# Patient Record
Sex: Female | Born: 1960 | Race: White | Hispanic: No | Marital: Married | Smoking: Never smoker
Health system: Southern US, Community
[De-identification: ages and names within clinical notes are randomized; demographics above are authoritative.]

## PROBLEM LIST (undated history)

## (undated) DIAGNOSIS — E079 Disorder of thyroid, unspecified: Secondary | ICD-10-CM

## (undated) DIAGNOSIS — E785 Hyperlipidemia, unspecified: Secondary | ICD-10-CM

## (undated) DIAGNOSIS — E119 Type 2 diabetes mellitus without complications: Secondary | ICD-10-CM

## (undated) HISTORY — PX: CARPAL TUNNEL RELEASE: SHX101

## (undated) HISTORY — DX: Hyperlipidemia, unspecified: E78.5

## (undated) HISTORY — PX: OTHER SURGICAL HISTORY: SHX169

## (undated) HISTORY — PX: APPENDECTOMY: SHX54

---

## 1998-05-24 ENCOUNTER — Ambulatory Visit (HOSPITAL_COMMUNITY): Admission: RE | Admit: 1998-05-24 | Discharge: 1998-05-24 | Payer: Self-pay | Admitting: Endocrinology

## 1998-12-25 ENCOUNTER — Other Ambulatory Visit: Admission: RE | Admit: 1998-12-25 | Discharge: 1998-12-25 | Payer: Self-pay | Admitting: *Deleted

## 1999-03-20 ENCOUNTER — Ambulatory Visit (HOSPITAL_COMMUNITY): Admission: RE | Admit: 1999-03-20 | Discharge: 1999-03-20 | Payer: Self-pay | Admitting: *Deleted

## 1999-03-20 ENCOUNTER — Encounter: Payer: Self-pay | Admitting: *Deleted

## 1999-03-26 ENCOUNTER — Ambulatory Visit (HOSPITAL_COMMUNITY): Admission: RE | Admit: 1999-03-26 | Discharge: 1999-03-26 | Payer: Self-pay | Admitting: *Deleted

## 1999-03-26 ENCOUNTER — Encounter: Payer: Self-pay | Admitting: *Deleted

## 2000-03-13 ENCOUNTER — Other Ambulatory Visit: Admission: RE | Admit: 2000-03-13 | Discharge: 2000-03-13 | Payer: Self-pay | Admitting: *Deleted

## 2000-06-16 ENCOUNTER — Other Ambulatory Visit: Admission: RE | Admit: 2000-06-16 | Discharge: 2000-06-16 | Payer: Self-pay | Admitting: *Deleted

## 2000-11-04 ENCOUNTER — Other Ambulatory Visit: Admission: RE | Admit: 2000-11-04 | Discharge: 2000-11-04 | Payer: Self-pay | Admitting: *Deleted

## 2000-11-20 ENCOUNTER — Ambulatory Visit (HOSPITAL_COMMUNITY): Admission: RE | Admit: 2000-11-20 | Discharge: 2000-11-20 | Payer: Self-pay | Admitting: *Deleted

## 2000-11-20 ENCOUNTER — Encounter: Payer: Self-pay | Admitting: *Deleted

## 2001-06-10 ENCOUNTER — Other Ambulatory Visit: Admission: RE | Admit: 2001-06-10 | Discharge: 2001-06-10 | Payer: Self-pay | Admitting: *Deleted

## 2001-06-10 ENCOUNTER — Encounter (INDEPENDENT_AMBULATORY_CARE_PROVIDER_SITE_OTHER): Payer: Self-pay

## 2002-06-11 ENCOUNTER — Other Ambulatory Visit: Admission: RE | Admit: 2002-06-11 | Discharge: 2002-06-11 | Payer: Self-pay | Admitting: *Deleted

## 2003-01-19 ENCOUNTER — Encounter: Payer: Self-pay | Admitting: *Deleted

## 2003-01-19 ENCOUNTER — Ambulatory Visit (HOSPITAL_COMMUNITY): Admission: RE | Admit: 2003-01-19 | Discharge: 2003-01-19 | Payer: Self-pay | Admitting: *Deleted

## 2003-01-20 ENCOUNTER — Encounter: Payer: Self-pay | Admitting: *Deleted

## 2003-01-20 ENCOUNTER — Encounter: Admission: RE | Admit: 2003-01-20 | Discharge: 2003-01-20 | Payer: Self-pay | Admitting: *Deleted

## 2003-06-17 ENCOUNTER — Other Ambulatory Visit: Admission: RE | Admit: 2003-06-17 | Discharge: 2003-06-17 | Payer: Self-pay | Admitting: *Deleted

## 2004-06-14 ENCOUNTER — Ambulatory Visit (HOSPITAL_COMMUNITY): Admission: RE | Admit: 2004-06-14 | Discharge: 2004-06-14 | Payer: Self-pay | Admitting: *Deleted

## 2004-06-20 ENCOUNTER — Other Ambulatory Visit: Admission: RE | Admit: 2004-06-20 | Discharge: 2004-06-20 | Payer: Self-pay | Admitting: *Deleted

## 2005-06-27 ENCOUNTER — Other Ambulatory Visit: Admission: RE | Admit: 2005-06-27 | Discharge: 2005-06-27 | Payer: Self-pay | Admitting: *Deleted

## 2005-07-29 ENCOUNTER — Ambulatory Visit (HOSPITAL_COMMUNITY): Admission: RE | Admit: 2005-07-29 | Discharge: 2005-07-29 | Payer: Self-pay | Admitting: *Deleted

## 2005-09-11 ENCOUNTER — Encounter: Admission: RE | Admit: 2005-09-11 | Discharge: 2005-09-11 | Payer: Self-pay | Admitting: Orthopaedic Surgery

## 2006-07-23 ENCOUNTER — Other Ambulatory Visit: Admission: RE | Admit: 2006-07-23 | Discharge: 2006-07-23 | Payer: Self-pay | Admitting: Obstetrics & Gynecology

## 2006-08-12 ENCOUNTER — Ambulatory Visit (HOSPITAL_COMMUNITY): Admission: RE | Admit: 2006-08-12 | Discharge: 2006-08-12 | Payer: Self-pay | Admitting: Obstetrics & Gynecology

## 2007-07-27 ENCOUNTER — Other Ambulatory Visit: Admission: RE | Admit: 2007-07-27 | Discharge: 2007-07-27 | Payer: Self-pay | Admitting: Obstetrics and Gynecology

## 2008-07-27 ENCOUNTER — Other Ambulatory Visit: Admission: RE | Admit: 2008-07-27 | Discharge: 2008-07-27 | Payer: Self-pay | Admitting: Obstetrics & Gynecology

## 2008-08-02 ENCOUNTER — Encounter: Admission: RE | Admit: 2008-08-02 | Discharge: 2008-08-02 | Payer: Self-pay | Admitting: Obstetrics & Gynecology

## 2008-09-01 ENCOUNTER — Ambulatory Visit (HOSPITAL_BASED_OUTPATIENT_CLINIC_OR_DEPARTMENT_OTHER): Admission: RE | Admit: 2008-09-01 | Discharge: 2008-09-01 | Payer: Self-pay | Admitting: Obstetrics and Gynecology

## 2009-08-29 ENCOUNTER — Encounter: Admission: RE | Admit: 2009-08-29 | Discharge: 2009-08-29 | Payer: Self-pay | Admitting: Obstetrics and Gynecology

## 2009-09-07 ENCOUNTER — Encounter: Admission: RE | Admit: 2009-09-07 | Discharge: 2009-09-07 | Payer: Self-pay | Admitting: Obstetrics and Gynecology

## 2011-01-13 ENCOUNTER — Encounter: Payer: Self-pay | Admitting: Otolaryngology

## 2011-01-13 ENCOUNTER — Encounter: Payer: Self-pay | Admitting: Obstetrics & Gynecology

## 2011-01-13 ENCOUNTER — Encounter: Payer: Self-pay | Admitting: *Deleted

## 2011-01-13 ENCOUNTER — Encounter: Payer: Self-pay | Admitting: Obstetrics and Gynecology

## 2011-05-07 NOTE — Op Note (Signed)
NAME:  Tammy Wu, Tammy Wu                 ACCOUNT NO.:  [id number]   MEDICAL RECORD NO.:  [id number]          PATIENT TYPE:  AMB   LOCATION:  [facility]                         FACILITY:  [facility]   PHYSICIAN:  Cynthia P. Romine, M.D.DATE OF BIRTH:  1961/01/07   DATE OF PROCEDURE:  09/01/2008  DATE OF DISCHARGE:                               OPERATIVE REPORT   PREOPERATIVE DIAGNOSIS:  Menorrhagia.   POSTOPERATIVE DIAGNOSIS:  Menorrhagia.   OPERATION/PROCEDURE:  Hydrotherm ablation of the endometrium.   SURGEON:  Cynthia P. Romine, M.D.   ANESTHESIA:  General by LMA.   ESTIMATED BLOOD LOSS:  Minimal.   COMPLICATIONS:  None.   PROCEDURE:  The patient was taken to the operating room and after  induction of adequate general anesthesia by LMA was placed in dorsal  lithotomy position and prepped and draped in the usual fashion.  The  bladder was drained with a red rubber catheter.  A posterior weighted  and anterior Sims retractor were placed and the cervix was grasped on  its anterior lip with a single-tooth tenaculum.  The uterus sounded to  7.5 cm.  The cervix was dilated to a #27 Shawnie Pons.  The hysteroscope was  then introduced and would not pass through the endocervix;  therefore,  the scope was withdrawn.  The cervix was dilated to a #29 and the scope  was then inserted.  The cavity appeared very atrophic.  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  With great persistence, the tubal ostia on the left  and right could be visualized but they were difficult to get to because  of the synechiae.  Once the surgeon was satisfied that that the cavity  had indeed been entered by noting both tubal ostia and the scope was  withdrawn to the level just inside the internal cervical os and  hydrotherm e ablation was carried out without difficulty in the usual  technique.   Photographic documentation was taken of the cavity after the  procedure.  The scope was withdrawn.  The fluid was cooled.  The  procedure was terminated.  The instruments removed from vagina and the  procedure was terminated.  The patient was taken to the recovery room in  satisfactory condition.      Cynthia P. Romine, M.D.  Electronically Signed     CPR/MEDQ  D:  09/01/2008  T:  09/02/2008  Job:  [id number]

## 2011-07-03 ENCOUNTER — Other Ambulatory Visit (HOSPITAL_COMMUNITY): Payer: Self-pay | Admitting: Family Medicine

## 2011-07-03 DIAGNOSIS — Z1231 Encounter for screening mammogram for malignant neoplasm of breast: Secondary | ICD-10-CM

## 2011-07-31 ENCOUNTER — Ambulatory Visit (HOSPITAL_COMMUNITY)
Admission: RE | Admit: 2011-07-31 | Discharge: 2011-07-31 | Disposition: A | Discharge status: Home / Self Care | Payer: BC Managed Care – PPO | Source: Ambulatory Visit | Attending: Family Medicine | Admitting: Family Medicine

## 2011-07-31 DIAGNOSIS — Z1231 Encounter for screening mammogram for malignant neoplasm of breast: Secondary | ICD-10-CM

## 2011-09-25 LAB — CBC
HCT: 41.9
Hemoglobin: 13.8
MCHC: 33.1
MCV: 84.5
Platelets: 410 — ABNORMAL HIGH
RBC: 4.95
RDW: 13.5

## 2011-09-25 LAB — HCG, SERUM, QUALITATIVE: Preg, Serum: NEGATIVE

## 2012-11-09 DIAGNOSIS — G43009 Migraine without aura, not intractable, without status migrainosus: Secondary | ICD-10-CM | POA: Insufficient documentation

## 2013-06-21 ENCOUNTER — Other Ambulatory Visit (HOSPITAL_COMMUNITY): Payer: Self-pay | Admitting: Family Medicine

## 2013-06-21 DIAGNOSIS — Z1231 Encounter for screening mammogram for malignant neoplasm of breast: Secondary | ICD-10-CM

## 2013-06-29 ENCOUNTER — Ambulatory Visit (HOSPITAL_COMMUNITY)
Admission: RE | Admit: 2013-06-29 | Discharge: 2013-06-29 | Disposition: A | Discharge status: Home / Self Care | Payer: BC Managed Care – PPO | Source: Ambulatory Visit | Attending: Family Medicine | Admitting: Family Medicine

## 2013-06-29 DIAGNOSIS — Z1231 Encounter for screening mammogram for malignant neoplasm of breast: Secondary | ICD-10-CM

## 2013-07-01 ENCOUNTER — Other Ambulatory Visit: Payer: Self-pay | Admitting: Family Medicine

## 2013-07-01 DIAGNOSIS — R928 Other abnormal and inconclusive findings on diagnostic imaging of breast: Secondary | ICD-10-CM

## 2013-07-13 ENCOUNTER — Ambulatory Visit
Admission: RE | Admit: 2013-07-13 | Discharge: 2013-07-13 | Disposition: A | Discharge status: Home / Self Care | Payer: BC Managed Care – PPO | Source: Ambulatory Visit | Attending: Family Medicine | Admitting: Family Medicine

## 2013-07-13 DIAGNOSIS — R928 Other abnormal and inconclusive findings on diagnostic imaging of breast: Secondary | ICD-10-CM

## 2014-06-27 ENCOUNTER — Other Ambulatory Visit (HOSPITAL_COMMUNITY): Payer: Self-pay | Admitting: Family Medicine

## 2014-06-27 DIAGNOSIS — Z1231 Encounter for screening mammogram for malignant neoplasm of breast: Secondary | ICD-10-CM

## 2014-06-29 DIAGNOSIS — R202 Paresthesia of skin: Secondary | ICD-10-CM | POA: Insufficient documentation

## 2014-06-30 DIAGNOSIS — E039 Hypothyroidism, unspecified: Secondary | ICD-10-CM | POA: Insufficient documentation

## 2014-07-14 ENCOUNTER — Ambulatory Visit (HOSPITAL_COMMUNITY)
Admission: RE | Admit: 2014-07-14 | Discharge: 2014-07-14 | Disposition: A | Discharge status: Home / Self Care | Payer: BC Managed Care – PPO | Source: Ambulatory Visit | Attending: Family Medicine | Admitting: Family Medicine

## 2014-07-14 DIAGNOSIS — Z1231 Encounter for screening mammogram for malignant neoplasm of breast: Secondary | ICD-10-CM | POA: Insufficient documentation

## 2015-09-21 ENCOUNTER — Other Ambulatory Visit: Payer: Self-pay | Admitting: Nurse Practitioner

## 2015-09-21 DIAGNOSIS — Z780 Asymptomatic menopausal state: Secondary | ICD-10-CM

## 2016-01-08 ENCOUNTER — Other Ambulatory Visit: Payer: Self-pay

## 2016-01-08 DIAGNOSIS — Z1231 Encounter for screening mammogram for malignant neoplasm of breast: Secondary | ICD-10-CM

## 2016-02-08 ENCOUNTER — Ambulatory Visit: Payer: BC Managed Care – PPO

## 2016-02-08 ENCOUNTER — Other Ambulatory Visit: Payer: BC Managed Care – PPO

## 2016-03-04 ENCOUNTER — Ambulatory Visit
Admission: RE | Admit: 2016-03-04 | Discharge: 2016-03-04 | Disposition: A | Discharge status: Home / Self Care | Payer: BC Managed Care – PPO | Source: Ambulatory Visit | Attending: Nurse Practitioner | Admitting: Nurse Practitioner

## 2016-03-04 ENCOUNTER — Ambulatory Visit
Admission: RE | Admit: 2016-03-04 | Discharge: 2016-03-04 | Disposition: A | Discharge status: Home / Self Care | Payer: BC Managed Care – PPO | Source: Ambulatory Visit

## 2016-03-04 DIAGNOSIS — Z780 Asymptomatic menopausal state: Secondary | ICD-10-CM

## 2016-03-04 DIAGNOSIS — Z1231 Encounter for screening mammogram for malignant neoplasm of breast: Secondary | ICD-10-CM

## 2016-04-18 DIAGNOSIS — L819 Disorder of pigmentation, unspecified: Secondary | ICD-10-CM | POA: Insufficient documentation

## 2016-04-28 ENCOUNTER — Encounter (HOSPITAL_COMMUNITY): Payer: Self-pay | Admitting: Nurse Practitioner

## 2016-04-28 ENCOUNTER — Ambulatory Visit (HOSPITAL_COMMUNITY)
Admission: EM | Admit: 2016-04-28 | Discharge: 2016-04-28 | Disposition: A | Discharge status: Home / Self Care | Payer: BC Managed Care – PPO | Attending: Emergency Medicine | Admitting: Emergency Medicine

## 2016-04-28 DIAGNOSIS — J309 Allergic rhinitis, unspecified: Secondary | ICD-10-CM

## 2016-04-28 DIAGNOSIS — R059 Cough, unspecified: Secondary | ICD-10-CM

## 2016-04-28 DIAGNOSIS — R05 Cough: Secondary | ICD-10-CM

## 2016-04-28 HISTORY — DX: Disorder of thyroid, unspecified: E07.9

## 2016-04-28 HISTORY — DX: Type 2 diabetes mellitus without complications: E11.9

## 2016-04-28 MED ORDER — IPRATROPIUM BROMIDE 0.02 % IN SOLN
RESPIRATORY_TRACT | Status: AC
  Filled 2016-04-28: qty 2.5

## 2016-04-28 MED ORDER — ALBUTEROL SULFATE HFA 108 (90 BASE) MCG/ACT IN AERS: 1.0000 | INHALATION_SPRAY | Freq: Four times a day (QID) | RESPIRATORY_TRACT | Status: AC | PRN

## 2016-04-28 MED ORDER — ALBUTEROL SULFATE (2.5 MG/3ML) 0.083% IN NEBU
2.5000 mg | INHALATION_SOLUTION | Freq: Once | RESPIRATORY_TRACT | Status: AC
  Administered 2016-04-28: 2.5 mg via RESPIRATORY_TRACT

## 2016-04-28 MED ORDER — IPRATROPIUM BROMIDE 0.02 % IN SOLN
0.5000 mg | Freq: Once | RESPIRATORY_TRACT | Status: AC
  Administered 2016-04-28: 0.5 mg via RESPIRATORY_TRACT

## 2016-04-28 MED ORDER — METHYLPREDNISOLONE SODIUM SUCC 125 MG IJ SOLR
125.0000 mg | Freq: Once | INTRAMUSCULAR | Status: AC
  Administered 2016-04-28: 125 mg via INTRAMUSCULAR

## 2016-04-28 MED ORDER — ALBUTEROL SULFATE (2.5 MG/3ML) 0.083% IN NEBU
INHALATION_SOLUTION | RESPIRATORY_TRACT | Status: AC
  Filled 2016-04-28: qty 3

## 2016-04-28 MED ORDER — METHYLPREDNISOLONE SODIUM SUCC 125 MG IJ SOLR
INTRAMUSCULAR | Status: AC
  Filled 2016-04-28: qty 2

## 2016-04-28 NOTE — Discharge Instructions (Signed)
Allergic Rhinitis Allergic rhinitis is when the mucous membranes in the nose respond to allergens. Allergens are particles in the air that cause your body to have an allergic reaction. This causes you to release allergic antibodies. Through a chain of events, these eventually cause you to release histamine into the blood stream. Although meant to protect the body, it is this release of histamine that causes your discomfort, such as frequent sneezing, congestion, and an itchy, runny nose.  CAUSES Seasonal allergic rhinitis (hay fever) is caused by pollen allergens that may come from grasses, trees, and weeds. Year-round allergic rhinitis (perennial allergic rhinitis) is caused by allergens such as house dust mites, pet dander, and mold spores. SYMPTOMS  Nasal stuffiness (congestion).  Itchy, runny nose with sneezing and tearing of the eyes. DIAGNOSIS Your health care provider can help you determine the allergen or allergens that trigger your symptoms. If you and your health care provider are unable to determine the allergen, skin or blood testing may be used. Your health care provider will diagnose your condition after taking your health history and performing a physical exam. Your health care provider may assess you for other related conditions, such as asthma, pink eye, or an ear infection. TREATMENT Allergic rhinitis does not have a cure, but it can be controlled by:  Medicines that block allergy symptoms. These may include allergy shots, nasal sprays, and oral antihistamines.  Avoiding the allergen. Hay fever may often be treated with antihistamines in pill or nasal spray forms. Antihistamines block the effects of histamine. There are over-the-counter medicines that may help with nasal congestion and swelling around the eyes. Check with your health care provider before taking or giving this medicine. If avoiding the allergen or the medicine prescribed do not work, there are many new medicines  your health care provider can prescribe. Stronger medicine may be used if initial measures are ineffective. Desensitizing injections can be used if medicine and avoidance does not work. Desensitization is when a patient is given ongoing shots until the body becomes less sensitive to the allergen. Make sure you follow up with your health care provider if problems continue. HOME CARE INSTRUCTIONS It is not possible to completely avoid allergens, but you can reduce your symptoms by taking steps to limit your exposure to them. It helps to know exactly what you are allergic to so that you can avoid your specific triggers. SEEK MEDICAL CARE IF:  You have a fever.  You develop a cough that does not stop easily (persistent).  You have shortness of breath.  You start wheezing.  Symptoms interfere with normal daily activities.   This information is not intended to replace advice given to you by your health care provider. Make sure you discuss any questions you have with your health care provider.   Document Released: 09/03/2001 Document Revised: 12/30/2014 Document Reviewed: 08/16/2013 Elsevier Interactive Patient Education 2016 Elsevier Inc. Bronchospasm, Adult A bronchospasm is when the tubes that carry air in and out of your lungs (airways) spasm or tighten. During a bronchospasm it is hard to breathe. This is because the airways get smaller. A bronchospasm can be triggered by:  Allergies. These may be to animals, pollen, food, or mold.  Infection. This is a common cause of bronchospasm.  Exercise.  Irritants. These include pollution, cigarette smoke, strong odors, aerosol sprays, and paint fumes.  Weather changes.  Stress.  Being emotional. HOME CARE   Always have a plan for getting help. Know when to call your doctor  doctor and local emergency services (911 in the [place] Know where you can get emergency care. °· Only take medicines as told by your doctor. °· If you were prescribed an  inhaler or nebulizer machine, ask your doctor how to use it correctly. Always use a spacer with your inhaler if you were given one. °· Stay calm during an attack. Try to relax and breathe more slowly. °· Control your home environment: °¨ Change your heating and air conditioning filter at least once a month. °¨ Limit your use of fireplaces and wood stoves. °¨ Do not  smoke. Do not  allow smoking in your home. °¨ Avoid perfumes and fragrances. °¨ Get rid of pests (such as roaches and mice) and their droppings. °¨ Throw away plants if you see mold on them. °¨ Keep your house clean and dust free. °¨ Replace carpet with wood, tile, or vinyl flooring. Carpet can trap dander and dust. °¨ Use allergy-proof pillows, mattress covers, and box spring covers. °¨ Wash bed sheets and blankets every week in hot water. Dry them in a dryer. °¨ Use blankets that are made of polyester or cotton. °¨ Wash hands frequently. °GET HELP IF: °· You have muscle aches. °· You have chest pain. °· The thick spit you spit or cough up (sputum) changes from clear or white to yellow, green, gray, or bloody. °· The thick spit you spit or cough up gets thicker. °· There are problems that may be related to the medicine you are given such as: °¨ A rash. °¨ Itching. °¨ Swelling. °¨ Trouble breathing. °GET HELP RIGHT AWAY IF: °· You feel you cannot breathe or catch your breath. °· You cannot stop coughing. °· Your treatment is not helping you breathe better. °· You have very bad chest pain. °MAKE SURE YOU:  °· Understand these instructions. °· Will watch your condition. °· Will get help right away if you are not doing well or get worse. °  °This information is not intended to replace advice given to you by your health care provider. Make sure you discuss any questions you have with your health care provider. °  °Document Released: 10/06/2009 Document Revised: 12/30/2014 Document Reviewed: 06/01/2013 °Elsevier Interactive Patient Education ©2016 Elsevier  Inc. ° °

## 2016-04-28 NOTE — ED Provider Notes (Signed)
CSN: [id number]     Arrival date & time 04/28/16  1301 History   None    Chief Complaint  Patient presents with  . Cough   (Consider location/radiation/quality/duration/timing/severity/associated sxs/prior Treatment)  HPI   The patient is a 55 year old female presenting today with complaints of "not feeling well" for 2 weeks.  The patient states that she has seasonal allergies for which she takes zyrtec.  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  States developed into a sinus infection for which she was placed on Augmentin last Tuesday with her primary.  States history of "wheezing" for which she is unable to use her inhaler as she is "out".  States has gotten worse over past 3-4 days with development of cough and wheezing.   Past Medical History  Diagnosis Date  . Thyroid disease   . Diabetes mellitus without complication [facility])    Past Surgical History  Procedure Laterality Date  . Appendectomy     History reviewed. No pertinent family history. Social History  Substance Use Topics  . Smoking status: Never Smoker   . Smokeless tobacco: None  . Alcohol Use: Yes   OB History    No data available     Review of Systems  Constitutional: Negative.  Negative for fever and chills.  HENT: Negative.  Negative for sinus pressure, sneezing and sore throat.   Eyes: Negative.   Respiratory: Positive for cough, shortness of breath and wheezing.   Cardiovascular: Negative.   Gastrointestinal: Negative.   Endocrine: Negative.        History of diabetes.  States she is well controlled.   Genitourinary: Negative.   Musculoskeletal: Negative.   Skin: Negative.  Negative for pallor and rash.  Allergic/Immunologic: Positive for environmental allergies. Negative for food allergies and immunocompromised state.  Neurological: Negative.   Hematological: Negative.   Psychiatric/Behavioral: Negative.     Allergies  Benadryl and Ceclor  Home Medications   Prior to Admission medications    Medication Sig Start Date End Date Taking? Authorizing Provider  cetirizine (ZYRTEC) 5 MG tablet Take 5 mg by mouth daily.   Yes Historical Provider, MD  glimepiride (AMARYL) 1 MG tablet Take 1 mg by mouth daily with breakfast.   Yes Historical Provider, MD  levothyroxine (SYNTHROID, LEVOTHROID) 112 MCG tablet Take 112 mcg by mouth daily before breakfast.   Yes Historical Provider, MD  pravastatin (PRAVACHOL) 10 MG tablet Take by mouth daily.   Yes Historical Provider, MD  sitaGLIPtin (JANUVIA) 100 MG tablet Take 100 mg by mouth daily.   Yes Historical Provider, MD  topiramate (TOPAMAX) 15 MG capsule Take 15 mg by mouth 2 (two) times daily.   Yes Historical Provider, MD  albuterol (PROVENTIL HFA;VENTOLIN HFA) 108 (90 Base) MCG/ACT inhaler Inhale 1-2 puffs into the lungs every 6 (six) hours as needed for wheezing or shortness of breath. 04/28/16   Servando Salina, NP   Meds Ordered and Administered this Visit   Medications  methylPREDNISolone sodium succinate (SOLU-MEDROL) 125 mg/2 mL injection 125 mg (not administered)  albuterol (PROVENTIL) (2.5 MG/3ML) 0.083% nebulizer solution 2.5 mg (2.5 mg Nebulization Given 04/28/16 1354)  ipratropium (ATROVENT) nebulizer solution 0.5 mg (0.5 mg Nebulization Given 04/28/16 1354)    BP 146/85 mmHg  Pulse 77  Temp(Src) 97.8 F (36.6 C) (Oral)  Resp 17  SpO2 98% No data found.   Physical Exam  Constitutional: She appears well-developed and well-nourished. No distress.  HENT:  Head: Normocephalic and atraumatic.  Right  Ear: External ear normal.  Left Ear: External ear normal.  Mouth/Throat: Oropharynx is clear and moist. No oropharyngeal exudate.  Eyes: Pupils are equal, round, and reactive to light. Right eye exhibits no discharge. Left eye exhibits no discharge. No scleral icterus.  Neck: Normal range of motion. Neck supple. No tracheal deviation present.  Negative for nuchal rigidity.  Cardiovascular: Normal rate, regular rhythm, normal heart  sounds and intact distal pulses.  Exam reveals no gallop and no friction rub.   No murmur heard. Pulmonary/Chest: Effort normal and breath sounds normal. No stridor. No respiratory distress. She has no wheezes. She has no rales. She exhibits no tenderness.  Some coughing noted with deep breathing.  Air exchange diminished at bases.  No adventitious breath sounds noted.   Lymphadenopathy:    She has no cervical adenopathy.  Skin: Skin is warm and dry. No rash noted. She is not diaphoretic. No erythema. No pallor.  Nursing note and vitals reviewed.   ED Course  Procedures (including critical care time)  Labs Review Labs Reviewed - No data to display  Imaging Review No results found.   Patient given duoneb HHN.  Tolerated well.  Increased air exchange heard in bases and patient is able to take a deep breath without coughing. The patient states she wants "a steroid shot".  States will help resolve symptoms.  Understands increased risk of hyperglycemia.  Patient states diabetes well controlled with last A1C at 7.0   MDM   1. Allergic rhinitis, unspecified allergic rhinitis type   2. Cough    Meds ordered this encounter  Medications  . levothyroxine (SYNTHROID, LEVOTHROID) 112 MCG tablet    Sig: Take 112 mcg by mouth daily before breakfast.  . sitaGLIPtin (JANUVIA) 100 MG tablet    Sig: Take 100 mg by mouth daily.  Marland Kitchen. glimepiride (AMARYL) 1 MG tablet    Sig: Take 1 mg by mouth daily with breakfast.  . pravastatin (PRAVACHOL) 10 MG tablet    Sig: Take by mouth daily.  Marland Kitchen. topiramate (TOPAMAX) 15 MG capsule    Sig: Take 15 mg by mouth 2 (two) times daily.  . cetirizine (ZYRTEC) 5 MG tablet    Sig: Take 5 mg by mouth daily.  Marland Kitchen. albuterol (PROVENTIL) (2.5 MG/3ML) 0.083% nebulizer solution 2.5 mg    Sig:   . ipratropium (ATROVENT) nebulizer solution 0.5 mg    Sig:   . methylPREDNISolone sodium succinate (SOLU-MEDROL) 125 mg/2 mL injection 125 mg    Sig:   . albuterol (PROVENTIL  HFA;VENTOLIN HFA) 108 (90 Base) MCG/ACT inhaler    Sig: Inhale 1-2 puffs into the lungs every 6 (six) hours as needed for wheezing or shortness of breath.    Dispense:  1 Inhaler    Refill:  3    Plan of care was discussed with Dr. Artis FlockKindl.  The patient verbalizes understanding and agrees to plan of care.  The patient was given solumedrol injection and albuterol inhalers.    Servando Salinaatherine H Rossi, NP 04/28/16 1430

## 2016-04-28 NOTE — ED Notes (Signed)
Pt c/o 2 week history of nasal congestion, fevers. Her pcp placed her on augmentin for a sinus infection but her symptoms have persisted and now she is having a cough and feels like her cold has "moved to my chest." she is alert and breathing easily

## 2016-11-17 ENCOUNTER — Ambulatory Visit (HOSPITAL_COMMUNITY)
Admission: EM | Admit: 2016-11-17 | Discharge: 2016-11-17 | Disposition: A | Discharge status: Home / Self Care | Payer: BC Managed Care – PPO | Attending: Internal Medicine | Admitting: Internal Medicine

## 2016-11-17 ENCOUNTER — Encounter (HOSPITAL_COMMUNITY): Payer: Self-pay | Admitting: Emergency Medicine

## 2016-11-17 DIAGNOSIS — J4521 Mild intermittent asthma with (acute) exacerbation: Secondary | ICD-10-CM | POA: Diagnosis not present

## 2016-11-17 DIAGNOSIS — B9689 Other specified bacterial agents as the cause of diseases classified elsewhere: Secondary | ICD-10-CM

## 2016-11-17 DIAGNOSIS — J019 Acute sinusitis, unspecified: Secondary | ICD-10-CM

## 2016-11-17 MED ORDER — METHYLPREDNISOLONE SODIUM SUCC 125 MG IJ SOLR
125.0000 mg | Freq: Once | INTRAMUSCULAR | Status: AC
  Administered 2016-11-17: 125 mg via INTRAMUSCULAR

## 2016-11-17 MED ORDER — METHYLPREDNISOLONE SODIUM SUCC 125 MG IJ SOLR
INTRAMUSCULAR | Status: AC
  Filled 2016-11-17: qty 2

## 2016-11-17 MED ORDER — AMOXICILLIN-POT CLAVULANATE 875-125 MG PO TABS: 1.0000 | ORAL_TABLET | Freq: Two times a day (BID) | ORAL | 0 refills | Status: DC

## 2016-11-17 NOTE — ED Triage Notes (Signed)
PT reports facial pain and pressure with sinus drainage and chest congestion since Tuesday.

## 2016-11-17 NOTE — ED Provider Notes (Signed)
[facility]    CSN: [id number] Arrival date & time: 11/17/16  1523     History   Chief Complaint Chief Complaint  Patient presents with  . Facial Pain    HPI Tammy Wu is a 55 y.o. female. She is a Sport and exercise psychologistsinger. She has history of asthma and diabetes. She presents today with 5 day history of runny/congested nose, headache, sore throat. Dry cough, starting to be sore from coughing. Little bit of achiness. Hoarseness. No nausea/vomiting, no diarrhea. She says that she is prone to this sort of thing, and sometimes has to have steroids. She has to sing this week.    HPI  Past Medical History:  Diagnosis Date  . Diabetes mellitus without complication (HCC)   . Thyroid disease      Past Surgical History:  Procedure Laterality Date  . APPENDECTOMY    . CARPAL TUNNEL RELEASE    . uterine ablation         Home Medications    Prior to Admission medications   Medication Sig Start Date End Date Taking? Authorizing Provider  albuterol (PROVENTIL HFA;VENTOLIN HFA) 108 (90 Base) MCG/ACT inhaler Inhale 1-2 puffs into the lungs every 6 (six) hours as needed for wheezing or shortness of breath. 04/28/16   Servando Salinaatherine H Rossi, NP  amoxicillin-clavulanate (AUGMENTIN) 875-125 MG tablet Take 1 tablet by mouth every 12 (twelve) hours. 11/17/16   Eustace MooreLaura W Opie Fanton, MD  cetirizine (ZYRTEC) 5 MG tablet Take 5 mg by mouth daily.    Historical Provider, MD  glimepiride (AMARYL) 1 MG tablet Take 1 mg by mouth daily with breakfast.    Historical Provider, MD  levothyroxine (SYNTHROID, LEVOTHROID) 112 MCG tablet Take 112 mcg by mouth daily before breakfast.    Historical Provider, MD  pravastatin (PRAVACHOL) 10 MG tablet Take by mouth daily.    Historical Provider, MD  sitaGLIPtin (JANUVIA) 100 MG tablet Take 100 mg by mouth daily.    Historical Provider, MD  topiramate (TOPAMAX) 15 MG capsule Take 15 mg by mouth 2 (two) times daily.    Historical Provider, MD    Family History No family  history on file.  Social History Social History  Substance Use Topics  . Smoking status: Never Smoker  . Smokeless tobacco: Never Used  . Alcohol use Yes     Comment: rarely     Allergies   Benadryl [diphenhydramine] and Ceclor [cefaclor]   Review of Systems Review of Systems  All other systems reviewed and are negative.    Physical Exam Triage Vital Signs ED Triage Vitals  Enc Vitals Group     BP 11/17/16 1608 139/80     Pulse Rate 11/17/16 1608 92     Resp 11/17/16 1608 16     Temp 11/17/16 1608 98.6 F (37 C)     Temp Source 11/17/16 1608 Oral     SpO2 11/17/16 1608 99 %     Weight 11/17/16 1608 116 lb (52.6 kg)     Height 11/17/16 1608 5\' 2"  (1.575 m)     Pain Score 11/17/16 1609 4   Updated Vital Signs BP 139/80   Pulse 92   Temp 98.6 F (37 C) (Oral)   Resp 16   Ht 5\' 2"  (1.575 m)   Wt 116 lb (52.6 kg)   SpO2 99%   BMI 21.22 kg/m  Physical Exam  Constitutional: She is oriented to person, place, and time.  Alert, nicely groomed Looks ill but not toxic  HENT:  Head: Atraumatic.  Bilateral TMs are dull, no erythema Marked nasal congestion Throat is red  Eyes:  Conjugate gaze, no eye redness/drainage  Neck: Neck supple.  Cardiovascular: Normal rate and regular rhythm.   Pulmonary/Chest: No respiratory distress.  Coarse but symmetric breath sounds throughout. Frequent coughing during exam  Abdominal: She exhibits no distension.  Musculoskeletal: Normal range of motion.  No leg swelling  Neurological: She is alert and oriented to person, place, and time.  Skin: Skin is warm and dry.  No cyanosis  Nursing note and vitals reviewed.    UC Treatments / Results   Procedures Procedures (including critical care time)  Medications Ordered in UC Medications  methylPREDNISolone sodium succinate (SOLU-MEDROL) 125 mg/2 mL injection 125 mg (125 mg Intramuscular Given 11/17/16 1658)     Final Clinical Impressions(s) / UC Diagnoses   Final  diagnoses:  Mild intermittent asthmatic bronchitis with acute exacerbation  Acute bacterial sinusitis   Injection of solumedrol (steroid, methylprednisolone, 125mg ) was given at the urgent care today.  Prescription for amoxicillin/clavulanate was sent to the [facility] at [place].  Recheck for increasing phlegm production/nasal discharge, new fever >100.5, or if not starting to improve in a few days.  New Prescriptions Discharge Medication List as of 11/17/2016  4:54 PM    START taking these medications   Details  amoxicillin-clavulanate (AUGMENTIN) 875-125 MG tablet Take 1 tablet by mouth every 12 (twelve) hours., Starting Sun 11/17/2016, Normal         Eustace MooreLaura W Monterius Rolf, MD 11/24/16 2222

## 2016-11-17 NOTE — Discharge Instructions (Addendum)
Injection of solumedrol (steroid, methylprednisolone, 125mg ) was given at the urgent care today.  Prescription for amoxicillin/clavulanate was sent to the [facility] at [place].  Recheck for increasing phlegm production/nasal discharge, new fever >100.5, or if not starting to improve in a few days.

## 2017-03-04 ENCOUNTER — Ambulatory Visit (INDEPENDENT_AMBULATORY_CARE_PROVIDER_SITE_OTHER): Payer: BC Managed Care – PPO | Admitting: Sports Medicine

## 2017-03-04 ENCOUNTER — Ambulatory Visit: Payer: Self-pay

## 2017-03-04 ENCOUNTER — Ambulatory Visit (INDEPENDENT_AMBULATORY_CARE_PROVIDER_SITE_OTHER): Payer: BC Managed Care – PPO

## 2017-03-04 ENCOUNTER — Encounter: Payer: Self-pay | Admitting: Sports Medicine

## 2017-03-04 VITALS — BP 130/90 | HR 74 | Ht 62.0 in | Wt 118.2 lb

## 2017-03-04 DIAGNOSIS — S0993XA Unspecified injury of face, initial encounter: Secondary | ICD-10-CM

## 2017-03-04 DIAGNOSIS — S8991XA Unspecified injury of right lower leg, initial encounter: Secondary | ICD-10-CM | POA: Diagnosis not present

## 2017-03-04 DIAGNOSIS — K219 Gastro-esophageal reflux disease without esophagitis: Secondary | ICD-10-CM | POA: Insufficient documentation

## 2017-03-04 DIAGNOSIS — S4991XA Unspecified injury of right shoulder and upper arm, initial encounter: Secondary | ICD-10-CM

## 2017-03-04 DIAGNOSIS — E119 Type 2 diabetes mellitus without complications: Secondary | ICD-10-CM

## 2017-03-04 MED ORDER — NAPROXEN-ESOMEPRAZOLE 500-20 MG PO TBEC: 1.0000 | DELAYED_RELEASE_TABLET | Freq: Two times a day (BID) | ORAL | 2 refills | Status: DC

## 2017-03-04 NOTE — Assessment & Plan Note (Signed)
Injury consistent with a right shoulder contusion.  If any lack of improvement consider further evaluation with diagnostic ultrasound of the right shoulder.

## 2017-03-04 NOTE — Assessment & Plan Note (Addendum)
Patient had a direct fall onto her knee with generalized swelling and pain. I am concerned for potential acute avulsion fracture of the patellar tendon at the medial portion of the tibial tubercle.  CT scan of the right knee indicated given concern for potential weightbearing nondisplaced fracture.  Knee immobilizer provided and recommend minimizing activity until further evaluation.

## 2017-03-04 NOTE — Assessment & Plan Note (Signed)
She Does Have a History of GI Intolerance and a History of Acid Reflux Therefore Vimovo Prescribed.

## 2017-03-04 NOTE — Progress Notes (Signed)
Tammy Wu - 56 y.o. female MRN [id number]  Date of birth: 09-19-1961  Office Visit Note: Visit Date: 03/04/2017 PCP: Tammy Inch, MD Referred by: No ref. provider found  Subjective: Chief Complaint  Patient presents with  . A Fall at [facility] 03/04/17    Pt was cleaning snow off her car this morning when she slipped and fell forward on concrete. C/o of RT shoulder, Hip, Leg, and face Pain. No loss of consciousness. Pt has applied ice to the areas after incident with relief. Brusing with abrasions on knee and nose.   HPI: Patient reports a fall today landing directly onto anterior aspect of her right knee as well as right upper extremity.  She did strike her face and is having pain along the tip of her nose but no nasal bridge discomfort.  She denies any nausea, vomiting, loss of consciousness blurry vision, difficulty focusing or headache.  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 ROS: She is not any type of blood thinning medications.  No prior issues with her right knee or right shoulder.  Otherwise per HPI.  Objective:  VS:  HT:5\' 2"  (157.5 cm)   WT:118 lb 3.2 oz (53.6 kg)  BMI:21.7    BP:130/90  HR:74bpm  TEMP: ( )  RESP:98 % Physical Exam: GENERAL:  WDWN, NAD, Non-toxic appearing PSYCH:  Alert & appropriately interactive  Not depressed or anxious appearing  Head is generally atraumatic with only small superficial abrasion across the bridge of her nose as well as the tip of her nose.  She has no pain to palpation along the nasal bone, frontal bones, maxillary bones or mandible.  She has good alignment of her jaws. UPPER & LOWER EXTREMITIES:  No significant generalized UE edema.  No significant pretibial edema.  No clubbing or cyanosis.  Radial pulses 2+/4.  DP & PT pulses 2+/4  Sensation intact to light touch in upper and lower extremities. Right shoulder: Patient is only minimally guarding this.  She has no focal tenderness to  palpation across the clavicle, AC joint, AC joint, acromion, proximal humerus.  Internal rotation external rotation strength is intact.  She has good elbow and wrist motion.  No focal bony tenderness there.  Minimal pain with Hawkins and Neer's.  Empty can testing, speeds testing and Yergason's testing strength is intact with only mild pain. Right knee: Small amount of generalized swelling without significant effusion.  Marked tenderness to palpation across the anterior aspect of the knee at the distal pole of patella and most focally over the anterior proximal tibia.  Moderate to severe pain along the lateral joint line.  Minimal on the medial.  She has a moderate degree of pain with varus and valgus stressing.  Small amount of bruising across the anterior aspect of the tibia and inferior pole of patella.  Extensor mechanism is intact.   Imaging & Procedures: Dg Knee Complete 4 Views Right  Result Date: 03/04/2017 CLINICAL DATA:  Left and fell on ice this morning with persistent pain and erythema over the anterior aspect of the patella and proximal tibia. EXAM: RIGHT KNEE - COMPLETE 4+ VIEW COMPARISON:  None in PACs FINDINGS: The bones are subjectively osteopenic. There is no lytic lesion but a subtle permeative pattern is present. The joint spaces are well maintained. There is no acute fracture nor dislocation. There is a sliver of bone that projects over the tibial tuberosity that could reflect an avulsion fracture fragment  but this is of uncertain age. No significant overlying soft tissue swelling is observed. There is no joint effusion. The prepatellar and infrapatellar soft tissues are normal. IMPRESSION: Diffuse osteopenia. No acute fracture nor dislocation nor significant degenerative change. Probable old avulsion fracture from the tibial tuberosity. Electronically Signed   By: David  SwazilandJordan M.D.   On: 03/04/2017 16:33   Limited bedside ultrasound performed images were not able to be saved however.   There was some neovascularity with a hypoechoic change with cortical irregularity at the insertion of the patellar tendon at the tibial tubercle.  Pain with sono palpation.  No focal osseous irregularities along the medial or lateral tibial plateaus although limited exam.  Does appear to be some increased swelling along the insertion of the LCL on the tibia with overlying hematoma that is small.  Only minimal effusion.  Assessment & Plan: Problem List Items Addressed This Visit    Knee injury, right, initial encounter - Primary    Patient had a direct fall onto her knee with generalized swelling and pain. I am concerned for potential acute avulsion fracture of the patellar tendon at the medial portion of the tibial tubercle.  CT scan of the right knee indicated given concern for potential weightbearing nondisplaced fracture.  Knee immobilizer provided and recommend minimizing activity until further evaluation.      Relevant Orders   DG Knee Complete 4 Views Right (Completed)   CT KNEE RIGHT WO CONTRAST   Injury of right shoulder    Injury consistent with a right shoulder contusion.  If any lack of improvement consider further evaluation with diagnostic ultrasound of the right shoulder.      Facial trauma, initial encounter    Superficial abrasions only.  No evidence or signs of significant intracranial injury.  No history of anticoagulation.  Patient denies signs of a concussion.      Diabetes mellitus without complication (HCC)    Need to minimize the use of NSAIDs however the patient declined any type of other pain control medication.       Acid reflux    She Does Have a History of GI Intolerance and a History of Acid Reflux Therefore Vimovo Prescribed.         Follow-up: Return for  we will call you about your results of the CT Scan.   Past Medical/Family/Surgical/Social History: Medications & Allergies reviewed per EMR Patient Active Problem List   Diagnosis Date Noted  .  Knee injury, right, initial encounter 03/04/2017  . Injury of right shoulder 03/04/2017  . Facial trauma, initial encounter 03/04/2017  . Diabetes mellitus without complication (HCC) 03/04/2017  . Acid reflux 03/04/2017   Past Medical History:  Diagnosis Date  . Diabetes mellitus without complication (HCC)   . Thyroid disease    History reviewed. No pertinent family history. Past Surgical History:  Procedure Laterality Date  . APPENDECTOMY    . CARPAL TUNNEL RELEASE    . uterine ablation     Social History   Occupational History  . Not on file.   Social History Main Topics  . Smoking status: Never Smoker  . Smokeless tobacco: Never Used  . Alcohol use Yes     Comment: rarely  . Drug use: No  . Sexual activity: Not on file

## 2017-03-04 NOTE — Assessment & Plan Note (Signed)
Superficial abrasions only.  No evidence or signs of significant intracranial injury.  No history of anticoagulation.  Patient denies signs of a concussion.

## 2017-03-04 NOTE — Patient Instructions (Signed)
We will plan to call you with results of the CT scan once we have them obtained.  I have sent a prescription to your pharmacy to [facility] will be calling you from a [phone] number.  This is for the anti-inflammatories.  Continue to ice..Marland Kitchen

## 2017-03-04 NOTE — Assessment & Plan Note (Signed)
Need to minimize the use of NSAIDs however the patient declined any type of other pain control medication.

## 2017-03-07 ENCOUNTER — Other Ambulatory Visit: Payer: BC Managed Care – PPO

## 2017-03-10 ENCOUNTER — Telehealth: Payer: Self-pay | Admitting: Sports Medicine

## 2017-03-10 ENCOUNTER — Ambulatory Visit
Admission: RE | Admit: 2017-03-10 | Discharge: 2017-03-10 | Disposition: A | Discharge status: Home / Self Care | Payer: BC Managed Care – PPO | Source: Ambulatory Visit | Attending: Sports Medicine | Admitting: Sports Medicine

## 2017-03-10 DIAGNOSIS — S8991XA Unspecified injury of right lower leg, initial encounter: Secondary | ICD-10-CM

## 2017-03-10 NOTE — Telephone Encounter (Signed)
Patient's husband came in to advise that patient needs a brace that leaves patella open, lateral and medial supports. Patient is unsure about coverage however, he would like to go to a sports store to possibly purchase. Please call patient's husband to advise on next steps.

## 2017-03-10 NOTE — Telephone Encounter (Signed)
I personally called and left a message advising the patient that a hinged knee brace is what I would recommend at this time.  I am happy to see her back and sure the options that we have otherwise an over-the-counter brace is acceptable at this time.  Her CT scan was negative for an acute fracture and she will need further evaluation if any lack of improvement.  If she calls back I am happy to try take her phone call otherwise we will try to touch base with her again tomorrow morning.

## 2017-03-11 NOTE — Telephone Encounter (Signed)
Patient returned call. I advised patient on note from Dr. Berline Choughigby. Patient expressed understanding verbally and exclaimed that she would call back if she needed to come back in for further evaluation. Patient did not ask any further clinical questions.

## 2017-07-31 DIAGNOSIS — E1169 Type 2 diabetes mellitus with other specified complication: Secondary | ICD-10-CM | POA: Insufficient documentation

## 2017-07-31 DIAGNOSIS — E890 Postprocedural hypothyroidism: Secondary | ICD-10-CM | POA: Insufficient documentation

## 2017-07-31 DIAGNOSIS — E785 Hyperlipidemia, unspecified: Secondary | ICD-10-CM | POA: Insufficient documentation

## 2017-11-28 DIAGNOSIS — R52 Pain, unspecified: Secondary | ICD-10-CM | POA: Insufficient documentation

## 2018-05-17 ENCOUNTER — Encounter (HOSPITAL_COMMUNITY): Payer: Self-pay | Admitting: Emergency Medicine

## 2018-05-17 ENCOUNTER — Ambulatory Visit (HOSPITAL_COMMUNITY)
Admission: EM | Admit: 2018-05-17 | Discharge: 2018-05-17 | Disposition: A | Discharge status: Home / Self Care | Payer: BC Managed Care – PPO | Attending: Family Medicine | Admitting: Family Medicine

## 2018-05-17 DIAGNOSIS — H9203 Otalgia, bilateral: Secondary | ICD-10-CM | POA: Diagnosis present

## 2018-05-17 DIAGNOSIS — J029 Acute pharyngitis, unspecified: Secondary | ICD-10-CM | POA: Diagnosis present

## 2018-05-17 DIAGNOSIS — E079 Disorder of thyroid, unspecified: Secondary | ICD-10-CM | POA: Diagnosis not present

## 2018-05-17 DIAGNOSIS — Z794 Long term (current) use of insulin: Secondary | ICD-10-CM | POA: Insufficient documentation

## 2018-05-17 DIAGNOSIS — E119 Type 2 diabetes mellitus without complications: Secondary | ICD-10-CM | POA: Diagnosis not present

## 2018-05-17 DIAGNOSIS — J0100 Acute maxillary sinusitis, unspecified: Secondary | ICD-10-CM | POA: Diagnosis not present

## 2018-05-17 DIAGNOSIS — Z79899 Other long term (current) drug therapy: Secondary | ICD-10-CM | POA: Diagnosis not present

## 2018-05-17 LAB — POCT RAPID STREP A: Streptococcus, Group A Screen (Direct): NEGATIVE

## 2018-05-17 MED ORDER — FLUCONAZOLE 150 MG PO TABS: 150.0000 mg | ORAL_TABLET | Freq: Once | ORAL | 0 refills | Status: AC

## 2018-05-17 MED ORDER — LEVOFLOXACIN 500 MG PO TABS: 500.0000 mg | ORAL_TABLET | Freq: Every day | ORAL | 0 refills | Status: DC

## 2018-05-17 NOTE — ED Provider Notes (Signed)
[facility]   [id number] 05/17/18 Arrival Time: 1429   SUBJECTIVE:  Tammy Wu is a 57 y.o. female who presents to the urgent care with complaint of bilateral ear pain and sore throat, states "I've had a sinus infection for a month"  Patient was given amoxicillin 2 weeks ago but this did not help at all. She has a history of allergies and has been taking allergy medicine which is also been of no avail.  Patient teaches music at [employer]  Past Medical History:  Diagnosis Date  . Diabetes mellitus without complication (HCC)   . Thyroid disease    No family history on file. Social History   Socioeconomic History  . Marital status: Married    Spouse name: Not on file  . Number of children: Not on file  . Years of education: Not on file  . Highest education level: Not on file  Occupational History  . Not on file  Social Needs  . Financial resource strain: Not on file  . Food insecurity:    Worry: Not on file    Inability: Not on file  . Transportation needs:    Medical: Not on file    Non-medical: Not on file  Tobacco Use  . Smoking status: Never Smoker  . Smokeless tobacco: Never Used  Substance and Sexual Activity  . Alcohol use: Yes    Comment: rarely  . Drug use: No  . Sexual activity: Not on file  Lifestyle  . Physical activity:    Days per week: Not on file    Minutes per session: Not on file  . Stress: Not on file  Relationships  . Social connections:    Talks on phone: Not on file    Gets together: Not on file    Attends religious service: Not on file    Active member of club or organization: Not on file    Attends meetings of clubs or organizations: Not on file    Relationship status: Not on file  . Intimate partner violence:    Fear of current or ex partner: Not on file    Emotionally abused: Not on file    Physically abused: Not on file    Forced sexual activity: Not on file  Other Topics Concern  . Not on file  Social  History Narrative  . Not on file   Current Meds  Medication Sig  . ATORVASTATIN CALCIUM PO Take by mouth.  . fexofenadine (ALLEGRA) 60 MG tablet Take 60 mg by mouth 2 (two) times daily.  . insulin aspart (NOVOLOG) 100 UNIT/ML injection Inject into the skin 3 (three) times daily before meals.  . INSULIN DEGLUDEC [place] Inject into the skin.   Allergies  Allergen Reactions  . Benadryl [Diphenhydramine] Anaphylaxis  . Codeine Other (See Comments)    vomiting  . Ceclor [Cefaclor]       ROS: As per HPI, remainder of ROS negative.   OBJECTIVE:   Vitals:   05/17/18 1516  BP: (!) 131/50  Pulse: 73  Resp: 16  Temp: 98.2 F (36.8 C)  SpO2: 100%     General appearance: alert; no distress Eyes: PERRL; EOMI; conjunctiva normal HENT: normocephalic; atraumatic; TMs normal, canal normal, external ears normal without trauma; nasal mucosa normal; oral mucosa normal Neck: supple Lungs: clear to auscultation bilaterally Heart: regular rate and rhythm Abdomen: soft, non-tender; bowel sounds normal; no masses or organomegaly; no guarding or rebound tenderness Back: no CVA tenderness Extremities:  no cyanosis or edema; symmetrical with no gross deformities Skin: warm and dry Neurologic: normal gait; grossly normal Psychological: alert and cooperative; normal mood and affect      Labs:  Results for orders placed or performed during the hospital encounter of 05/17/18  POCT rapid strep A [facility])  Result Value Ref Range   Streptococcus, Group A Screen (Direct) NEGATIVE NEGATIVE    Labs Reviewed  CULTURE, GROUP A STREP [facility])  POCT RAPID STREP A    No results found.     ASSESSMENT & PLAN:  1. Acute non-recurrent maxillary sinusitis     Meds ordered this encounter  Medications  . levofloxacin (LEVAQUIN) 500 MG tablet    Sig: Take 1 tablet (500 mg total) by mouth daily.    Dispense:  10 tablet    Refill:  0  . fluconazole (DIFLUCAN) 150 MG tablet    Sig: Take  1 tablet (150 mg total) by mouth once for 1 dose. Repeat if needed    Dispense:  2 tablet    Refill:  0    Reviewed expectations re: course of current medical issues. Questions answered. Outlined signs and symptoms indicating need for more acute intervention. Patient verbalized understanding. After Visit Summary given.    Procedures:      Elvina Sidle, MD 05/17/18 1545

## 2018-05-17 NOTE — ED Triage Notes (Signed)
Pt c/o bilateral ear pain and sore throat, states "ive had a sinus infection for a month"

## 2018-05-20 LAB — CULTURE, GROUP A STREP (THRC)

## 2018-06-24 ENCOUNTER — Other Ambulatory Visit: Payer: Self-pay | Admitting: Nurse Practitioner

## 2018-06-24 DIAGNOSIS — Z1231 Encounter for screening mammogram for malignant neoplasm of breast: Secondary | ICD-10-CM

## 2018-07-27 ENCOUNTER — Ambulatory Visit
Admission: RE | Admit: 2018-07-27 | Discharge: 2018-07-27 | Disposition: A | Discharge status: Home / Self Care | Payer: BC Managed Care – PPO | Source: Ambulatory Visit | Attending: Nurse Practitioner | Admitting: Nurse Practitioner

## 2018-07-27 DIAGNOSIS — Z1231 Encounter for screening mammogram for malignant neoplasm of breast: Secondary | ICD-10-CM

## 2019-07-19 ENCOUNTER — Other Ambulatory Visit: Payer: Self-pay | Admitting: Nurse Practitioner

## 2019-07-19 DIAGNOSIS — Z1231 Encounter for screening mammogram for malignant neoplasm of breast: Secondary | ICD-10-CM

## 2019-09-01 ENCOUNTER — Other Ambulatory Visit: Payer: Self-pay

## 2019-09-01 ENCOUNTER — Ambulatory Visit
Admission: RE | Admit: 2019-09-01 | Discharge: 2019-09-01 | Disposition: A | Discharge status: Home / Self Care | Payer: BC Managed Care – PPO | Source: Ambulatory Visit | Attending: Nurse Practitioner | Admitting: Nurse Practitioner

## 2019-09-01 DIAGNOSIS — Z1231 Encounter for screening mammogram for malignant neoplasm of breast: Secondary | ICD-10-CM

## 2019-12-28 ENCOUNTER — Other Ambulatory Visit: Payer: Self-pay

## 2019-12-28 ENCOUNTER — Encounter: Payer: Self-pay | Admitting: Family Medicine

## 2019-12-28 ENCOUNTER — Ambulatory Visit: Payer: BC Managed Care – PPO | Admitting: Family Medicine

## 2019-12-28 DIAGNOSIS — M545 Low back pain, unspecified: Secondary | ICD-10-CM

## 2019-12-28 DIAGNOSIS — M25551 Pain in right hip: Secondary | ICD-10-CM | POA: Diagnosis not present

## 2019-12-28 MED ORDER — GABAPENTIN 100 MG PO CAPS: ORAL_CAPSULE | ORAL | 3 refills | Status: DC

## 2019-12-28 NOTE — Progress Notes (Signed)
I saw and examined the patient with Dr. Robby Sermon and agree with assessment and plan as outlined.    LBP with right side sciatica due to recent move.  Will treat with PT.  Gabapentin as well.  Right groin pain for months, probable DJD.  Will try PT, glucosamine, turmeric.  Consider x-rays and dextrose prolotherapy if pain gets worse.

## 2019-12-28 NOTE — Progress Notes (Addendum)
Tammy Wu - 59 y.o. female MRN [id number]  Date of birth: Mar 02, 1961  Office Visit Note: Visit Date: 12/28/2019 PCP: Eartha Inch, MD Referred by: Eartha Inch, MD  Subjective: Chief Complaint  Patient presents with  . Pain rt lower back/leg & rt groin since 12/01/19 (moving)   HPI: Tammy Wu is a 59 y.o. female who comes in today with right buttocks/lower back pain for the past month as well as groin pain for the past several months.   Right buttocks- she reports that she was moving on 12/01/19 and while lifting boxes and furniture, she started to have pain in her right lower back with pain radiating down her right buttocks and back of right leg. The pain extends into her right foot. She has peripheral neuropathy at baseline due to diabetes. The pain has persisted for the past month, worse with prolonged sitting. She is very sensitive to medication as they make her nauseous. Her PCP provided valium which has helped with muscle spasms some but she takes it minimally as it makes her foggy. Ibuprofen and naproxen have helped minimally.  Right groin- pain with movement for the past several months since this summer. She is hesitant about a steroid injection as she has insulin dependent diabetes and she had a large spike in blood glucose to 300s after steroid ointment was used in the past.     ROS Otherwise per HPI.  Assessment & Plan: Visit Diagnoses:  1. Acute right-sided low back pain without sciatica   2. Pain in right hip     Plan: lower back pain with right sided sciatica- will trial gabapentin for neuropathic pain and will treat with PT for possible herniated disc causing radiculopathy. 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 Will trial glucosamine, tumeric, PT. If no improvement, will obtain x-rays and consider prolotherapy vs corticosteroid injection of hip.  Meds & Orders:  Meds ordered this encounter  Medications  . gabapentin (NEURONTIN) 100 MG  capsule    Sig: 1 PO q HS, may increase to 1 PO TID if needed    Dispense:  90 capsule    Refill:  3   No orders of the defined types were placed in this encounter.   Follow-up: No follow-ups on file.   Procedures: No procedures performed  No notes on file   Clinical History: No specialty comments available.   She reports that she has never smoked. She has never used smokeless tobacco. No results for input(s): HGBA1C, LABURIC in the last 8760 hours.  Objective:  VS:  HT:    WT:   BMI:     BP:   HR: bpm  TEMP: ( )  RESP:  Physical Exam  PHYSICAL EXAM: Gen: NAD, alert, cooperative with exam, well-appearing HEENT: clear conjunctiva,  CV:  no edema, capillary refill brisk, normal rate Resp: non-labored Skin: no rashes, normal turgor  Neuro: no gross deficits.  Psych:  alert and oriented  Ortho Exam  Lumbar spine: - Inspection: no gross deformity or asymmetry, swelling or ecchymosis - Palpation: TTP over R SI joint, R sciatic notch  - ROM: full active ROM of the lumbar spine in flexion and extension without pain - Strength: 5/5 strength of lower extremity in L4-S1 nerve root distributions b/l; normal gait - Neuro: sensation intact in the L4-S1 nerve root distribution b/l  R hip: Pain in groin with internal rotation of hip  Imaging: No results found.  Past Medical/Family/Surgical/Social History: Medications &  Allergies reviewed per EMR, new medications updated. Patient Active Problem List   Diagnosis Date Noted  . Pain 11/28/2017  . Hyperlipidemia associated with type 2 diabetes mellitus ([place]) 07/31/2017  . Postablative hypothyroidism 07/31/2017  . Knee injury, right, initial encounter 03/04/2017  . Injury of right shoulder 03/04/2017  . Facial trauma, initial encounter 03/04/2017  . Diabetes mellitus without complication ([place]) 20/23/3435  . Acid reflux 03/04/2017  . Hypopigmentation 04/18/2016  . Acquired hypothyroidism 06/30/2014  . Paresthesia 06/29/2014  .  Migraine without aura 11/09/2012   Past Medical History:  Diagnosis Date  . Diabetes mellitus without complication ([place])   . Thyroid disease    Family History  Problem Relation Age of Onset  . Breast cancer Maternal Aunt 68   Past Surgical History:  Procedure Laterality Date  . APPENDECTOMY    . CARPAL TUNNEL RELEASE    . uterine ablation     Social History   Occupational History  . Not on file  Tobacco Use  . Smoking status: Never Smoker  . Smokeless tobacco: Never Used  Substance and Sexual Activity  . Alcohol use: Yes    Comment: rarely  . Drug use: No  . Sexual activity: Not on file

## 2020-01-05 ENCOUNTER — Ambulatory Visit: Payer: BC Managed Care – PPO | Admitting: Orthopaedic Surgery

## 2020-02-21 ENCOUNTER — Ambulatory Visit: Payer: BC Managed Care – PPO | Admitting: Orthopedic Surgery

## 2020-02-21 ENCOUNTER — Ambulatory Visit: Payer: Self-pay

## 2020-02-21 ENCOUNTER — Other Ambulatory Visit: Payer: Self-pay

## 2020-02-21 DIAGNOSIS — M79604 Pain in right leg: Secondary | ICD-10-CM | POA: Diagnosis not present

## 2020-02-21 MED ORDER — CELECOXIB 100 MG PO CAPS: ORAL_CAPSULE | ORAL | 0 refills | Status: DC

## 2020-02-21 MED ORDER — METHOCARBAMOL 500 MG PO TABS: ORAL_TABLET | ORAL | 0 refills | Status: DC

## 2020-02-23 ENCOUNTER — Encounter: Payer: Self-pay | Admitting: Orthopedic Surgery

## 2020-02-23 NOTE — Progress Notes (Signed)
Office Visit Note   Patient: Tammy Wu           Date of Birth: 11-Sep-1961           MRN: [id number] Visit Date: 02/21/2020 Requested by: [name], MD [place],  [place] [address] PCP: [name], MD  Subjective: Chief Complaint  Patient presents with  . Right Leg - Pain    HPI: Tammy Wu is a 59 year old patient with right groin and leg pain and low back pain.  She injured her low back while moving 12/01/2019.  Reports radicular leg pain on the right-hand side with groin pain on that side as well.  She also has numbness and tingling extending down below the knee.  The pain wakes her from sleep at night.  This is essentially a 24/7 type of event for her.  She tried physical therapy for a month without relief.  Also tried Neurontin without much relief.  Made her very sleepy.  The patient works as a Pharmacist, hospital.  She does state that is hard for her to both to sit lay and walk.  She actually just finished caregiving for her ill father in December.  That involved a lot of lifting as well.              ROS: All systems reviewed are negative as they relate to the chief complaint within the history of present illness.  Patient denies  fevers or chills.   Assessment & Plan: Visit Diagnoses:  1. Pain in right leg     Plan: Impression is low back pain and right leg pain consistent with radiculopathy.  Not too much weakness.  Hip radiographs look reasonable.  She does have degenerative disc disease primarily at L5-S1.  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  Plan is MRI arthrogram along with Celebrex and Robaxin.  See her back after that study.  ESI's are likely.  She does have diabetes and she would need to be managed appropriately in that periinjection.  Time.  Follow-Up Instructions: Return for after MRI.   Orders:  Orders Placed This Encounter  Procedures  . XR Lumbar Spine 2-3 Views  . XR HIP UNILAT W OR W/O PELVIS 2-3 VIEWS RIGHT  . MR  Lumbar Spine w/o contrast   Meds ordered this encounter  Medications  . celecoxib (CELEBREX) 100 MG capsule    Sig: 1 po bid x 30 days    Dispense:  60 capsule    Refill:  0  . methocarbamol (ROBAXIN) 500 MG tablet    Sig: 1 po q 12hrs prn    Dispense:  30 tablet    Refill:  0      Procedures: No procedures performed   Clinical Data: No additional findings.  Objective: Vital Signs: There were no vitals taken for this visit.  Physical Exam:   Constitutional: Patient appears well-developed HEENT:  Head: Normocephalic Eyes:EOM are normal Neck: Normal range of motion Cardiovascular: Normal rate Pulmonary/chest: Effort normal Neurologic: Patient is alert Skin: Skin is warm Psychiatric: Patient has normal mood and affect    Ortho Exam: Ortho exam demonstrates full active and passive range of motion of knees and hips.  No groin pain with internal X rotation of the leg.  No definite paresthesias L1 S1 bilaterally.  Reflexes symmetric bilateral patella and Achilles.  Pedal pulses palpable.  Does have slightly antalgic gait to the right.  Some pain with forward lateral bending but  no trochanteric tenderness.  Specialty Comments:  No specialty comments available.  Imaging: No results found.   PMFS History: Patient Active Problem List   Diagnosis Date Noted  . Pain 11/28/2017  . Hyperlipidemia associated with type 2 diabetes mellitus (HCC) 07/31/2017  . Postablative hypothyroidism 07/31/2017  . Knee injury, right, initial encounter 03/04/2017  . Injury of right shoulder 03/04/2017  . Facial trauma, initial encounter 03/04/2017  . Diabetes mellitus without complication (HCC) 03/04/2017  . Acid reflux 03/04/2017  . Hypopigmentation 04/18/2016  . Acquired hypothyroidism 06/30/2014  . Paresthesia 06/29/2014  . Migraine without aura 11/09/2012   Past Medical History:  Diagnosis Date  . Diabetes mellitus without complication (HCC)   . Thyroid disease     Family  History  Problem Relation Age of Onset  . Breast cancer Maternal Aunt 68    Past Surgical History:  Procedure Laterality Date  . APPENDECTOMY    . CARPAL TUNNEL RELEASE    . uterine ablation     Social History   Occupational History  . Not on file  Tobacco Use  . Smoking status: Never Smoker  . Smokeless tobacco: Never Used  Substance and Sexual Activity  . Alcohol use: Yes    Comment: rarely  . Drug use: No  . Sexual activity: Not on file

## 2020-03-18 ENCOUNTER — Other Ambulatory Visit: Payer: BC Managed Care – PPO

## 2020-03-20 ENCOUNTER — Other Ambulatory Visit: Payer: Self-pay | Admitting: Orthopedic Surgery

## 2020-03-20 NOTE — Telephone Encounter (Signed)
Ok to rf? 

## 2020-03-22 ENCOUNTER — Ambulatory Visit: Payer: BC Managed Care – PPO | Admitting: Orthopedic Surgery

## 2020-03-25 ENCOUNTER — Other Ambulatory Visit: Payer: Self-pay

## 2020-03-25 ENCOUNTER — Ambulatory Visit
Admission: RE | Admit: 2020-03-25 | Discharge: 2020-03-25 | Disposition: A | Discharge status: Home / Self Care | Payer: BC Managed Care – PPO | Source: Ambulatory Visit | Attending: Orthopedic Surgery | Admitting: Orthopedic Surgery

## 2020-03-25 DIAGNOSIS — M79604 Pain in right leg: Secondary | ICD-10-CM

## 2020-03-29 ENCOUNTER — Ambulatory Visit: Payer: BC Managed Care – PPO | Admitting: Orthopedic Surgery

## 2020-03-29 ENCOUNTER — Encounter: Payer: Self-pay | Admitting: Orthopedic Surgery

## 2020-03-29 ENCOUNTER — Other Ambulatory Visit: Payer: Self-pay

## 2020-03-29 DIAGNOSIS — M545 Low back pain, unspecified: Secondary | ICD-10-CM

## 2020-03-29 DIAGNOSIS — M79604 Pain in right leg: Secondary | ICD-10-CM | POA: Diagnosis not present

## 2020-03-29 NOTE — Progress Notes (Signed)
Office Visit Note   Patient: Tammy Wu           Date of Birth: 07/18/1961           MRN: [id number] Visit Date: 03/29/2020 Requested by: [name], MD [place],  [place] [address] PCP: [name], MD  Subjective: Chief Complaint  Patient presents with  . Follow-up    HPI: [name] is a patient with low back pain.  Since have seen her she had an MRI scan of her lumbar spine.  She describes right-sided radicular symptoms.  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  Medications are helping.  She is not on blood thinners but she does have diabetes.  She is talked to her endocrinologist who is able to help her get through a series of epidural steroid injections for pain relief.  She still describes decreased walking endurance.  She would like to be able to walk regularly.  She has a lot on her socially with her home situation.              ROS: All systems reviewed are negative as they relate to the chief complaint within the history of present illness.  Patient denies  fevers or chills.   Assessment & Plan: Visit Diagnoses:  1. Pain in right leg   2. 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  I think with good medical management which her endocrinologist has offered that we could do epidural steroid injections x1 or 2 to see if that can help with pain relief.  We will get that set up with Dr. Ernestina Patches in the near future.  Follow-up with me as needed.  This does not look like a surgical problem.  Follow-Up Instructions: No follow-ups on file.   Orders:  Orders Placed This Encounter  Procedures  . Ambulatory referral to Physical Medicine Rehab   No orders of the defined types were placed in this encounter.     Procedures: No procedures performed   Clinical Data: No additional findings.  Objective: Vital  Signs: There were no vitals taken for this visit.  Physical Exam:   Constitutional: Patient appears well-developed HEENT:  Head: Normocephalic Eyes:EOM are normal Neck: Normal range of motion Cardiovascular: Normal rate Pulmonary/chest: Effort normal Neurologic: Patient is alert Skin: Skin is warm Psychiatric: Patient has normal mood and affect    Ortho Exam: Ortho exam demonstrates positive nerve root tension signs on the right negative on the left.  Not quite as bad as it was last clinic visit though.  She has good ankle dorsiflexion plantarflexion quad hamstring strength.  Mild pain with forward lateral bending.  No trochanteric tenderness is noted.  No definite paresthesias L1 S1 bilaterally.  Specialty Comments:  No specialty comments available.  Imaging: No results found.   PMFS History: Patient Active Problem List   Diagnosis Date Noted  . Pain 11/28/2017  . Hyperlipidemia associated with type 2 diabetes mellitus ([facility]) 07/31/2017  . Postablative hypothyroidism 07/31/2017  . Knee injury, right, initial encounter 03/04/2017  . Injury of right shoulder 03/04/2017  . Facial trauma, initial encounter 03/04/2017  . Diabetes mellitus without complication ([facility]) 55/73/2202  . Acid reflux 03/04/2017  . Hypopigmentation 04/18/2016  . Acquired hypothyroidism 06/30/2014  . Paresthesia 06/29/2014  . Migraine without aura 11/09/2012   Past Medical History:  Diagnosis Date  .  Diabetes mellitus without complication ([facility])   . Thyroid disease     Family History  Problem Relation Age of Onset  . Breast cancer Maternal Aunt 68    Past Surgical History:  Procedure Laterality Date  . APPENDECTOMY    . CARPAL TUNNEL RELEASE    . uterine ablation     Social History   Occupational History  . Not on file  Tobacco Use  . Smoking status: Never Smoker  . Smokeless tobacco: Never Used  Substance and Sexual Activity  . Alcohol use: Yes    Comment: rarely  . Drug use: No  .  Sexual activity: Not on file

## 2020-04-17 ENCOUNTER — Other Ambulatory Visit: Payer: Self-pay | Admitting: Surgical

## 2020-04-17 NOTE — Telephone Encounter (Signed)
Pls advise.  

## 2020-04-26 ENCOUNTER — Other Ambulatory Visit: Payer: Self-pay

## 2020-04-26 ENCOUNTER — Encounter: Payer: Self-pay | Admitting: Physical Medicine and Rehabilitation

## 2020-04-26 ENCOUNTER — Ambulatory Visit: Payer: Self-pay

## 2020-04-26 ENCOUNTER — Ambulatory Visit (INDEPENDENT_AMBULATORY_CARE_PROVIDER_SITE_OTHER): Payer: BC Managed Care – PPO | Admitting: Physical Medicine and Rehabilitation

## 2020-04-26 VITALS — BP 160/83 | HR 73

## 2020-04-26 DIAGNOSIS — M5416 Radiculopathy, lumbar region: Secondary | ICD-10-CM

## 2020-04-26 MED ORDER — METHYLPREDNISOLONE ACETATE 80 MG/ML IJ SUSP
40.0000 mg | Freq: Once | INTRAMUSCULAR | Status: AC
  Administered 2020-04-26: 40 mg

## 2020-04-26 NOTE — Progress Notes (Signed)
  side of the lower back and radiates into the right leg all the way down. Pt states pain started December 2020. Any movement makes pain worse. Ice packs helps with pain.   .Numeric Pain Rating Scale and Functional Assessment Average Pain 8   In the last MONTH (on 0-10 scale) has pain interfered with the following?  1. General activity like being  able to carry out your everyday physical activities such as walking, climbing stairs, carrying groceries, or moving a chair?  Rating(9)   +Driver, -BT, -Dye Allergies.

## 2020-04-27 NOTE — Procedures (Signed)
Lumbosacral Transforaminal Epidural Steroid Injection - Sub-Pedicular Approach with Fluoroscopic Guidance  Patient: Tammy Wu      Date of Birth: Apr 24, 1961 MRN: [id number] PCP: Eartha Inch, MD      Visit Date: 04/26/2020   Universal Protocol:    Date/Time: 04/26/2020  Consent Given By: the patient  Position: PRONE  Additional Comments: Vital signs were monitored before and after the procedure. Patient was prepped and draped in the usual sterile fashion. The correct patient, procedure, and site was verified.   Injection Procedure Details:  Procedure Site One Meds Administered:  Meds ordered this encounter  Medications  . methylPREDNISolone acetate (DEPO-MEDROL) injection 40 mg    Laterality: Right  Location/Site:  L5-S1  Needle size: 22 G  Needle type: Spinal  Needle Placement: Transforaminal  Findings:    -Comments: Excellent flow of contrast along the nerve and into the epidural space.  Procedure Details: After squaring off the end-plates to get a true AP view, the C-arm was positioned so that an oblique view of the foramen as noted above was visualized. The target area is just inferior to the "nose of the scotty dog" or sub pedicular. The soft tissues overlying this structure were infiltrated with 2-3 ml. of 1% Lidocaine without Epinephrine.  The spinal needle was inserted toward the target using a "trajectory" view along the fluoroscope beam.  Under AP and lateral visualization, the needle was advanced so it did not puncture dura and was located close the 6 O'Clock position of the pedical in AP tracterory. Biplanar projections were used to confirm position. Aspiration was confirmed to be negative for CSF and/or blood. A 1-2 ml. volume of Isovue-250 was injected and flow of contrast was noted at each level. Radiographs were obtained for documentation purposes.   After attaining the desired flow of contrast documented above, a 0.5 to 1.0 ml test dose of 0.25%  Marcaine was injected into each respective transforaminal space.  The patient was observed for 90 seconds post injection.  After no sensory deficits were reported, and normal lower extremity motor function was noted,   the above injectate was administered so that equal amounts of the injectate were placed at each foramen (level) into the transforaminal epidural space.   Additional Comments:  The patient tolerated the procedure well Dressing: 2 x 2 sterile gauze and Band-Aid    Post-procedure details: Patient was observed during the procedure. Post-procedure instructions were reviewed.  Patient left the clinic in stable condition.

## 2020-04-27 NOTE — Progress Notes (Signed)
Tammy Wu - 59 y.o. female MRN [id number]  Date of birth: 07/29/1961  Office Visit Note: Visit Date: 04/26/2020 PCP: [name], MD Referred by: [name], MD  Subjective: Chief Complaint  Patient presents with  . Lower Back - Pain  . Left Leg - Pain   HPI:  Tammy Wu is a 59 y.o. female who comes in today At the request of Dr. Anderson Malta for chronic worsening severe right radicular leg pain in a pretty classic L5 distribution. Planned Right L5-S1 lumbar epidural steroid injection with fluoroscopic guidance.  The patient has failed conservative care including home exercise, medications, time and activity modification.  This injection will be diagnostic and hopefully therapeutic.  Please see requesting physician notes for further details and justification.  MRI reviewed with images and spine model.  MRI reviewed in the note below.   ROS Otherwise per HPI.  Assessment & Plan: Visit Diagnoses:  1. Lumbar radiculopathy     Plan: No additional findings.   Meds & Orders:  Meds ordered this encounter  Medications  . methylPREDNISolone acetate (DEPO-MEDROL) injection 40 mg    Orders Placed This Encounter  Procedures  . XR C-ARM NO REPORT  . Epidural Steroid injection    Follow-up: Return if symptoms worsen or fail to improve.   Procedures: No procedures performed  Lumbosacral Transforaminal Epidural Steroid Injection - Sub-Pedicular Approach with Fluoroscopic Guidance  Patient: Tammy Wu      Date of Birth: 1961-06-23 MRN: [id number] PCP: [name], MD      Visit Date: 04/26/2020   Universal Protocol:    Date/Time: 04/26/2020  Consent Given By: the patient  Position: PRONE  Additional Comments: Vital signs were monitored before and after the procedure. Patient was prepped and draped in the usual sterile fashion. The correct patient, procedure, and site was verified.   Injection Procedure Details:  Procedure Site One Meds  Administered:  Meds ordered this encounter  Medications  . methylPREDNISolone acetate (DEPO-MEDROL) injection 40 mg    Laterality: Right  Location/Site:  L5-S1  Needle size: 22 G  Needle type: Spinal  Needle Placement: Transforaminal  Findings:    -Comments: Excellent flow of contrast along the nerve and into the epidural space.  Procedure Details: After squaring off the end-plates to get a true AP view, the C-arm was positioned so that an oblique view of the foramen as noted above was visualized. The target area is just inferior to the "nose of the scotty dog" or sub pedicular. The soft tissues overlying this structure were infiltrated with 2-3 ml. of 1% Lidocaine without Epinephrine.  The spinal needle was inserted toward the target using a "trajectory" view along the fluoroscope beam.  Under AP and lateral visualization, the needle was advanced so it did not puncture dura and was located close the 6 O'Clock position of the pedical in AP tracterory. Biplanar projections were used to confirm position. Aspiration was confirmed to be negative for CSF and/or blood. A 1-2 ml. volume of Isovue-250 was injected and flow of contrast was noted at each level. Radiographs were obtained for documentation purposes.   After attaining the desired flow of contrast documented above, a 0.5 to 1.0 ml test dose of 0.25% Marcaine was injected into each respective transforaminal space.  The patient was observed for 90 seconds post injection.  After no sensory deficits were reported, and normal lower extremity motor function was noted,   the above injectate was administered so  that equal amounts of the injectate were placed at each foramen (level) into the transforaminal epidural space.   Additional Comments:  The patient tolerated the procedure well Dressing: 2 x 2 sterile gauze and Band-Aid    Post-procedure details: Patient was observed during the procedure. Post-procedure instructions were  reviewed.  Patient left the clinic in stable condition.     Clinical History: MRI LUMBAR SPINE WITHOUT CONTRAST  TECHNIQUE: Multiplanar, multisequence MR imaging of the lumbar spine was performed. No intravenous contrast was administered.  COMPARISON:  None.  FINDINGS: Segmentation:  Standard.  Alignment:  Physiologic.  Vertebrae: No fracture, evidence of discitis, or bone lesion. Mildly heterogeneous bone marrow signal.  Conus medullaris and cauda equina: Conus extends to the L1 level. Conus and cauda equina appear normal.  Paraspinal and other soft tissues: Negative  Disc levels:  T12-L1: Normal disc space and facet joints. There is no spinal canal stenosis. No neural foraminal stenosis.  L1-L2: Normal disc space and facet joints. There is no spinal canal stenosis. No neural foraminal stenosis.  L2-L3: Normal disc space and facet joints. There is no spinal canal stenosis. No neural foraminal stenosis.  L3-L4: Left eccentric disc bulge. Left lateral recess narrowing without central spinal canal stenosis. Mild left neural foraminal stenosis.  L4-L5: Mild disc bulge and moderate facet hypertrophy. Right lateral recess narrowing without central spinal canal stenosis. Moderate right neural foraminal stenosis.  L5-S1: Small central protrusion of the disc. Mild facet hypertrophy. There is no spinal canal stenosis. Mild right neural foraminal stenosis.  Visualized sacrum: Normal.  IMPRESSION: 1. L4-L5 moderate right neural foraminal stenosis and narrowing of the right lateral recess. 2. L3-L4 left lateral recess and left neural foraminal stenosis. Correlate for left L3 or L4 radiculopathy. 3. L5-S1 mild right neural foraminal stenosis.   Electronically Signed   By: Deatra Robinson M.D.   On: 03/26/2020 00:11     Objective:  VS:  HT:    WT:   BMI:     BP:(!) 160/83  HR:73bpm  TEMP: ( )  RESP:  Physical Exam Constitutional:       General: She is not in acute distress.    Appearance: Normal appearance. She is not ill-appearing.  HENT:     Head: Normocephalic and atraumatic.     Right Ear: External ear normal.     Left Ear: External ear normal.  Eyes:     Extraocular Movements: Extraocular movements intact.  Cardiovascular:     Rate and Rhythm: Normal rate.     Pulses: Normal pulses.  Musculoskeletal:     Right lower leg: No edema.     Left lower leg: No edema.     Comments: Patient has good distal strength with no pain over the greater trochanters.  No clonus or focal weakness.  Skin:    Findings: No erythema, lesion or rash.  Neurological:     General: No focal deficit present.     Mental Status: She is alert and oriented to person, place, and time.     Sensory: No sensory deficit.     Motor: No weakness or abnormal muscle tone.     Coordination: Coordination normal.  Psychiatric:        Mood and Affect: Mood normal.        Behavior: Behavior normal.      Imaging: XR C-ARM NO REPORT  Result Date: 04/26/2020 Please see Notes tab for imaging impression.

## 2020-08-14 ENCOUNTER — Telehealth: Payer: Self-pay | Admitting: Orthopedic Surgery

## 2020-08-14 NOTE — Telephone Encounter (Signed)
Been 4+ months since last eval. Rec followup appointment for further eval

## 2020-08-14 NOTE — Telephone Encounter (Signed)
See below. IC patient (she was in class when I called) states she is having unbearable back pain and has not even gotten 5 minutes of relief at all. She had these done a few months ago. She has not had a new injury, states pain is constant and is waking at night with pain.  Wants to know what else can be done for her?

## 2020-08-14 NOTE — Telephone Encounter (Signed)
PTs husband called stating after the pt got her injections with Dr.Newton she started feeling extreme pain and would like a CB from [facility] to discuss more  [phone]

## 2020-08-15 ENCOUNTER — Ambulatory Visit: Payer: BC Managed Care – PPO | Admitting: Surgical

## 2020-08-15 ENCOUNTER — Encounter: Payer: Self-pay | Admitting: Surgical

## 2020-08-15 DIAGNOSIS — M79604 Pain in right leg: Secondary | ICD-10-CM

## 2020-08-15 DIAGNOSIS — R32 Unspecified urinary incontinence: Secondary | ICD-10-CM

## 2020-08-15 NOTE — Telephone Encounter (Signed)
Pt has appt this afternoon to discuss

## 2020-08-15 NOTE — Progress Notes (Signed)
Office Visit Note   Patient: Tammy Wu           Date of Birth: 02/23/1961           MRN: [id number] Visit Date: 08/15/2020 Requested by: Eartha Inch, MD [address] [place],  [place] [address] PCP: Eartha Inch, MD  Subjective: Chief Complaint  Patient presents with  . Back Pain    HPI: Tammy Wu is a 59 y.o. female who presents to the office complaining of back pain with right leg radicular pain.  Patient states that she had a prior epidural steroid injection by Dr. Alvester Morin at L5-S1 earlier this year that provided no significant relief even for several minutes.  She states that pain is intense and travels from her right low back down into her buttocks radiates around to her groin down her anterior thigh into her knee and down into the anterior lateral ankle where it stops.  She states she has constant pain that is worse with activity and with laying down.  Pain wakes her up at night and makes it difficult to fall asleep.  She states that her left leg is becoming weak as well which is new for her.  She denies any significant radicular pain down her left leg.  She also notes gradual onset of worsening bladder incontinence issues since early this summer.  And now bothers her every day.  She notes a constant leakage of urine throughout the day and this is worse with coughing or sneezing.  She denies any bowel incontinence or saddle anesthesia.  She does have a history of diabetes and her last A1c was about 7.9.  She takes 6 shots of insulin per day.  She has a long history of peripheral neuropathy.  She sees her endocrinologist in 2 weeks..                ROS: All systems reviewed are negative as they relate to the chief complaint within the history of present illness.  Patient denies fevers or chills.  Assessment & Plan: Visit Diagnoses:  1. Pain in right leg   2. Urinary incontinence, unspecified type     Plan: Patient is a 59 year old female who presents complaining  of right low back pain and radicular leg pain.  She has had severe right radicular leg pain that was not improved at all by a epidural steroid injection that was administered by Dr. Alvester Morin at L5-S1.  She had MRI scan of her low back in April that showed moderate right foraminal stenosis at L4-L5 as well as right foraminal stenosis at L5-S1.  She has recently developed bladder incontinence and was seen by her primary care physician who wanted her to follow-up with orthopedics and make sure that the incontinence was not stemming from her lumbar spine.  Reviewed the MRI scan which showed no significant narrowing of the spinal canal to the point that would cause urinary incontinence.  Additionally, by history it seems that her primary complaint is stress incontinence.  She is not having any other red flag symptoms such as bowel incontinence or saddle anesthesia.  Plan to refer patient to urology for further evaluation.  Will refer patient back to Dr. Alvester Morin to try another injection as well.  May be that another location may be more successful than the previous injection was.  She will discuss this plan with her endocrinologist at her next appointment and they will work on managing her blood glucose around this  plan.  Follow-up as needed.  Follow-Up Instructions: No follow-ups on file.   Orders:  Orders Placed This Encounter  Procedures  . Ambulatory referral to Physical Medicine Rehab  . Ambulatory referral to Urology   No orders of the defined types were placed in this encounter.     Procedures: No procedures performed   Clinical Data: No additional findings.  Objective: Vital Signs: There were no vitals taken for this visit.  Physical Exam:  Constitutional: Patient appears well-developed HEENT:  Head: Normocephalic Eyes:EOM are normal Neck: Normal range of motion Cardiovascular: Normal rate Pulmonary/chest: Effort normal Neurologic: Patient is alert Skin: Skin is warm Psychiatric:  Patient has normal mood and affect  Ortho Exam: Ortho exam demonstrates right leg with positive straight leg raise.  Negative straight leg raise on the left side.  Tenderness throughout the lumbar spine axially and on the right-sided paraspinal musculature.  No significant tenderness over the trochanteric bursa.  Mild weakness of the right and left hip flexors.  No significant weakness of the bilateral quadricep, hamstring, dorsiflexion, plantarflexion.  Sensation diminished throughout the bilateral lower extremities.  No evidence of clonus.  Specialty Comments:  No specialty comments available.  Imaging: No results found.   PMFS History: Patient Active Problem List   Diagnosis Date Noted  . Pain 11/28/2017  . Hyperlipidemia associated with type 2 diabetes mellitus ([facility]) 07/31/2017  . Postablative hypothyroidism 07/31/2017  . Knee injury, right, initial encounter 03/04/2017  . Injury of right shoulder 03/04/2017  . Facial trauma, initial encounter 03/04/2017  . Diabetes mellitus without complication ([facility]) 03/04/2017  . Acid reflux 03/04/2017  . Hypopigmentation 04/18/2016  . Acquired hypothyroidism 06/30/2014  . Paresthesia 06/29/2014  . Migraine without aura 11/09/2012   Past Medical History:  Diagnosis Date  . Diabetes mellitus without complication ([facility])   . Thyroid disease     Family History  Problem Relation Age of Onset  . Breast cancer Maternal Aunt 68    Past Surgical History:  Procedure Laterality Date  . APPENDECTOMY    . CARPAL TUNNEL RELEASE    . uterine ablation     Social History   Occupational History  . Not on file  Tobacco Use  . Smoking status: Never Smoker  . Smokeless tobacco: Never Used  Substance and Sexual Activity  . Alcohol use: Yes    Comment: rarely  . Drug use: No  . Sexual activity: Not on file

## 2020-08-21 ENCOUNTER — Telehealth: Payer: Self-pay | Admitting: Physical Medicine and Rehabilitation

## 2020-08-21 NOTE — Telephone Encounter (Signed)
Patient returning call from [facility] to set appt for back injections. Please call patient to set appt. Patient phone number is [phone].

## 2020-08-22 NOTE — Telephone Encounter (Signed)
Scheduled

## 2020-08-30 ENCOUNTER — Other Ambulatory Visit: Payer: Self-pay

## 2020-08-30 ENCOUNTER — Ambulatory Visit: Payer: Self-pay

## 2020-08-30 ENCOUNTER — Encounter: Payer: Self-pay | Admitting: Physical Medicine and Rehabilitation

## 2020-08-30 ENCOUNTER — Ambulatory Visit: Payer: BC Managed Care – PPO | Admitting: Physical Medicine and Rehabilitation

## 2020-08-30 VITALS — BP 155/87 | HR 82

## 2020-08-30 DIAGNOSIS — M5416 Radiculopathy, lumbar region: Secondary | ICD-10-CM

## 2020-08-30 DIAGNOSIS — M1611 Unilateral primary osteoarthritis, right hip: Secondary | ICD-10-CM

## 2020-08-30 DIAGNOSIS — M25551 Pain in right hip: Secondary | ICD-10-CM | POA: Diagnosis not present

## 2020-08-30 MED ORDER — METHYLPREDNISOLONE ACETATE 80 MG/ML IJ SUSP: 80.0000 mg | Freq: Once | INTRAMUSCULAR | Status: DC

## 2020-08-30 NOTE — Progress Notes (Signed)
Pt state lower back pain. Pt state the the pain travel dow her right leg back to her groin area. Pt state ice and heating pad with pain pill helps a little with pain. Pt has hx of inj on 04/26/20 pt state that it didn't do anything for her.  Numeric Pain Rating Scale and Functional Assessment Average Pain 9   In the last MONTH (on 0-10 scale) has pain interfered with the following?  1. General activity like being  able to carry out your everyday physical activities such as walking, climbing stairs, carrying groceries, or moving a chair?  Rating(10)   +Driver, -BT, -Dye Allergies.

## 2020-09-11 MED ORDER — BUPIVACAINE HCL 0.25 % IJ SOLN
4.0000 mL | INTRAMUSCULAR | Status: AC | PRN
  Administered 2020-08-30: 4 mL via INTRA_ARTICULAR

## 2020-09-11 MED ORDER — TRIAMCINOLONE ACETONIDE 40 MG/ML IJ SUSP
60.0000 mg | INTRAMUSCULAR | Status: AC | PRN
  Administered 2020-08-30: 60 mg via INTRA_ARTICULAR

## 2020-09-11 NOTE — Progress Notes (Signed)
Tammy Wu - 59 y.o. female MRN [id number]  Date of birth: [birth year]  Office Visit Note: Visit Date: 08/30/2020 PCP: Eartha Inch, MD Referred by: Eartha Inch, MD  Subjective: Chief Complaint  Patient presents with  . Lower Back - Pain   HPI:  Tammy Wu is a 59 y.o. female who comes in today For planned right L4 transforaminal injection at the request of Karenann Cai, PA-C and Dr. Burnard Bunting. By way of review patient underwent right L5 transforaminal injection on 04/26/2020 that diagnostically didn't help at all. Today she comes in again with excruciating 9 out of 10 pain and she really doesn't want to move her hip or leg that much at all. She has difficulty standing and walking. She actually brings with her a letter written by her husband pleading with [place] to give her some relief because of the amount of pain that she is having at home. Today after talking with her at great length she does endorse pain in the posterior lateral leg but she has a lot of pain referring into the groin. She does have this pain that travels down into the ankle more of an L5 distribution. Pain medications have not helped. We did discuss the fact that on a few occasions over the years I have seen hip pathology with a referral pattern similar to an L5 radicular pain. I'm not sure if this is a referral pattern from the joint capsule through the sciatic nerve and therefore there is some symptoms down to the ankle. Today we are going to complete a diagnostic and hopefully therapeutic anesthetic hip arthrogram. If this doesn't help diagnostically I would look at an L4 transforaminal injection. Otherwise she'll follow-up with Dr. August Saucer.  ROS Otherwise per HPI.  Assessment & Plan: Visit Diagnoses:  1. Pain in right hip   2. Lumbar radiculopathy   3. Unilateral primary osteoarthritis, right hip     Plan: No additional findings.   Meds & Orders:  Meds ordered this encounter  Medications  .  methylPREDNISolone acetate (DEPO-MEDROL) injection 80 mg    Orders Placed This Encounter  Procedures  . Large Joint Inj  . XR C-ARM NO REPORT    Follow-up: Return for visit to requesting physician as needed.   Procedures: Large Joint Inj: R hip joint on 08/30/2020 3:15 PM Indications: pain and diagnostic evaluation Details: 22 G needle, anterior approach  Arthrogram: Yes  Medications: 4 mL bupivacaine 0.25 %; 60 mg triamcinolone acetonide 40 MG/ML Outcome: tolerated well, no immediate complications  Arthrogram demonstrated excellent flow of contrast throughout the joint surface without extravasation or obvious defect.  The patient had relief of symptoms during the anesthetic phase of the injection.  Procedure, treatment alternatives, risks and benefits explained, specific risks discussed. Consent was given by the patient. Immediately prior to procedure a time out was called to verify the correct patient, procedure, equipment, support staff and site/side marked as required. Patient was prepped and draped in the usual sterile fashion.      No notes on file   Clinical History: MRI LUMBAR SPINE WITHOUT CONTRAST  TECHNIQUE: Multiplanar, multisequence MR imaging of the lumbar spine was performed. No intravenous contrast was administered.  COMPARISON:  None.  FINDINGS: Segmentation:  Standard.  Alignment:  Physiologic.  Vertebrae: No fracture, evidence of discitis, or bone lesion. Mildly heterogeneous bone marrow signal.  Conus medullaris and cauda equina: Conus extends to the L1 level. Conus and cauda equina appear normal.  Paraspinal  and other soft tissues: Negative  Disc levels:  T12-L1: Normal disc space and facet joints. There is no spinal canal stenosis. No neural foraminal stenosis.  L1-L2: Normal disc space and facet joints. There is no spinal canal stenosis. No neural foraminal stenosis.  L2-L3: Normal disc space and facet joints. There is no spinal  canal stenosis. No neural foraminal stenosis.  L3-L4: Left eccentric disc bulge. Left lateral recess narrowing without central spinal canal stenosis. Mild left neural foraminal stenosis.  L4-L5: Mild disc bulge and moderate facet hypertrophy. Right lateral recess narrowing without central spinal canal stenosis. Moderate right neural foraminal stenosis.  L5-S1: Small central protrusion of the disc. Mild facet hypertrophy. There is no spinal canal stenosis. Mild right neural foraminal stenosis.  Visualized sacrum: Normal.  IMPRESSION: 1. L4-L5 moderate right neural foraminal stenosis and narrowing of the right lateral recess. 2. L3-L4 left lateral recess and left neural foraminal stenosis. Correlate for left L3 or L4 radiculopathy. 3. L5-S1 mild right neural foraminal stenosis.   Electronically Signed   By: Deatra Robinson M.D.   On: 03/26/2020 00:11     Objective:  VS:  HT:    WT:   BMI:     BP:(!) 155/87  HR:82bpm  TEMP: ( )  RESP:  Physical Exam Constitutional:      General: She is not in acute distress.    Appearance: Normal appearance. She is not ill-appearing.  HENT:     Head: Normocephalic and atraumatic.     Right Ear: External ear normal.     Left Ear: External ear normal.  Eyes:     Extraocular Movements: Extraocular movements intact.  Cardiovascular:     Rate and Rhythm: Normal rate.     Pulses: Normal pulses.  Musculoskeletal:     Right lower leg: No edema.     Left lower leg: No edema.     Comments: Patient has good distal strength with no pain over the greater trochanters.  No clonus or focal weakness. She does have pain with internal rotation of the right hip at the end range. This is concordant pain.  Skin:    Findings: No erythema, lesion or rash.  Neurological:     General: No focal deficit present.     Mental Status: She is alert and oriented to person, place, and time.     Sensory: No sensory deficit.     Motor: No weakness or  abnormal muscle tone.     Coordination: Coordination normal.  Psychiatric:        Mood and Affect: Mood normal.        Behavior: Behavior normal.      Imaging: No results found.

## 2020-10-06 ENCOUNTER — Other Ambulatory Visit: Payer: Self-pay | Admitting: Nurse Practitioner

## 2020-10-06 DIAGNOSIS — Z1231 Encounter for screening mammogram for malignant neoplasm of breast: Secondary | ICD-10-CM

## 2020-11-01 ENCOUNTER — Ambulatory Visit: Payer: BC Managed Care – PPO

## 2020-11-23 ENCOUNTER — Encounter: Payer: Self-pay | Admitting: Radiology

## 2020-12-07 ENCOUNTER — Other Ambulatory Visit: Payer: Self-pay

## 2020-12-07 ENCOUNTER — Ambulatory Visit
Admission: RE | Admit: 2020-12-07 | Discharge: 2020-12-07 | Disposition: A | Discharge status: Home / Self Care | Payer: BC Managed Care – PPO | Source: Ambulatory Visit | Attending: Nurse Practitioner | Admitting: Nurse Practitioner

## 2020-12-07 DIAGNOSIS — Z1231 Encounter for screening mammogram for malignant neoplasm of breast: Secondary | ICD-10-CM

## 2020-12-13 ENCOUNTER — Other Ambulatory Visit: Payer: Self-pay | Admitting: Nurse Practitioner

## 2020-12-13 DIAGNOSIS — R928 Other abnormal and inconclusive findings on diagnostic imaging of breast: Secondary | ICD-10-CM

## 2020-12-21 ENCOUNTER — Ambulatory Visit
Admission: RE | Admit: 2020-12-21 | Discharge: 2020-12-21 | Disposition: A | Discharge status: Home / Self Care | Payer: BC Managed Care – PPO | Source: Ambulatory Visit | Attending: Nurse Practitioner | Admitting: Nurse Practitioner

## 2020-12-21 ENCOUNTER — Other Ambulatory Visit: Payer: Self-pay

## 2020-12-21 DIAGNOSIS — R928 Other abnormal and inconclusive findings on diagnostic imaging of breast: Secondary | ICD-10-CM

## 2020-12-27 ENCOUNTER — Telehealth: Payer: Self-pay | Admitting: Orthopedic Surgery

## 2020-12-27 NOTE — Telephone Encounter (Signed)
Patient requesting xrays from 02/23/20. I advised her she will need to get the MRI from [facility]. Please call when CD ready. [phone]

## 2020-12-27 NOTE — Telephone Encounter (Signed)
Left voicemail advising patient CD was ready for pickup at front desk.  

## 2021-05-27 IMAGING — MR MR LUMBAR SPINE W/O CM
4 of 5 series · 18 of 48 positions shown · non-contrast
Comparison: None.

CLINICAL DATA: Lumbar radiculopathy. Right radicular leg pain.
Injury while lifting furniture.

EXAM:
MRI LUMBAR SPINE WITHOUT CONTRAST
TECHNIQUE: Multiplanar, multisequence MR imaging of the lumbar spine was
performed. No intravenous contrast was administered.

[Series 5: T2 · sagittal · 4.0mm · 0.73mm/px · 6 of 15 slices shown (1 of 2)]
[im 1/15]
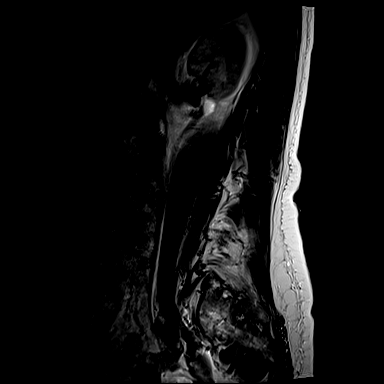
[im 3/15]
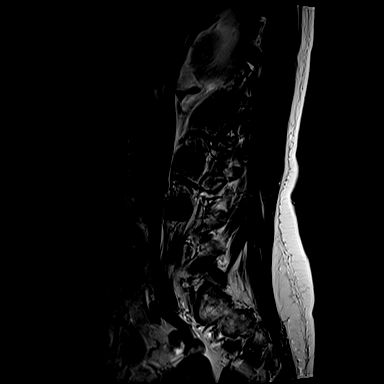
[im 6/15]
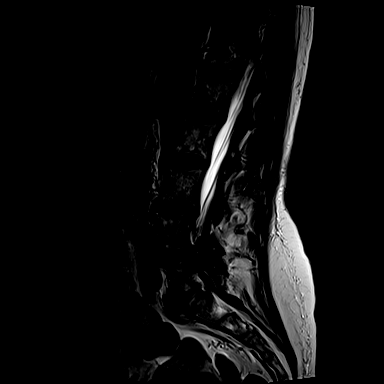
[im 9/15]
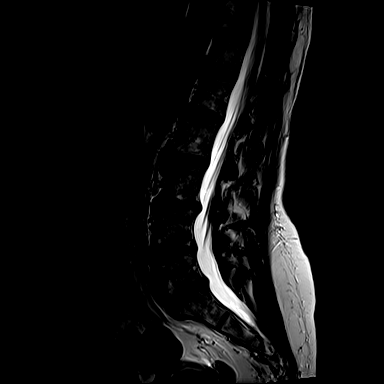
[im 12/15]
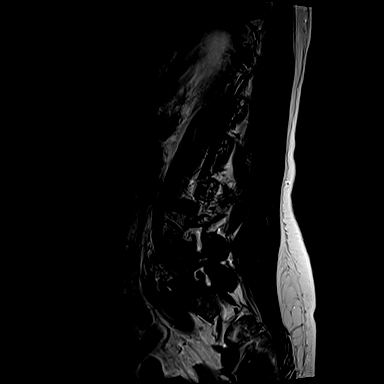
[im 15/15]
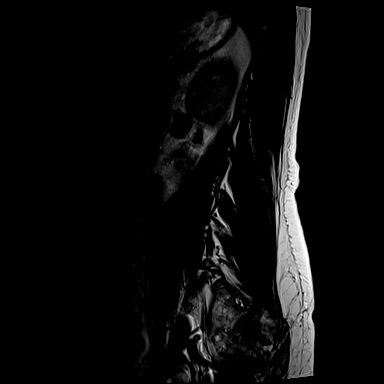

[Series 6: T1 · sagittal · 4.0mm · 0.73mm/px · 3 of 15 slices shown (1 of 2)]
[im 3/15]
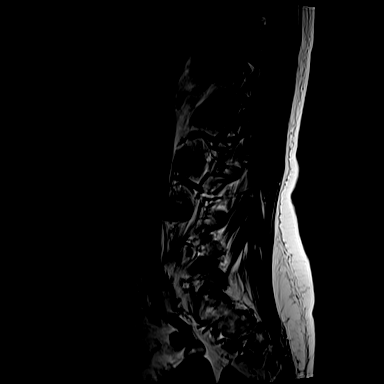
[im 9/15]
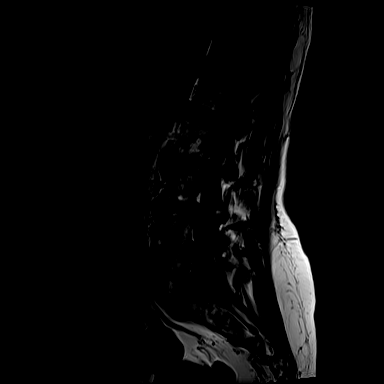
[im 15/15]
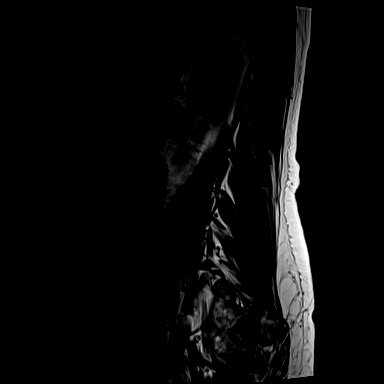

[Series 10: T1 · axial · 4.0mm · 0.28mm/px · z∈[-78,+72]mm · 3 of 37 slices shown (2 of 2)]
[im 6/37]
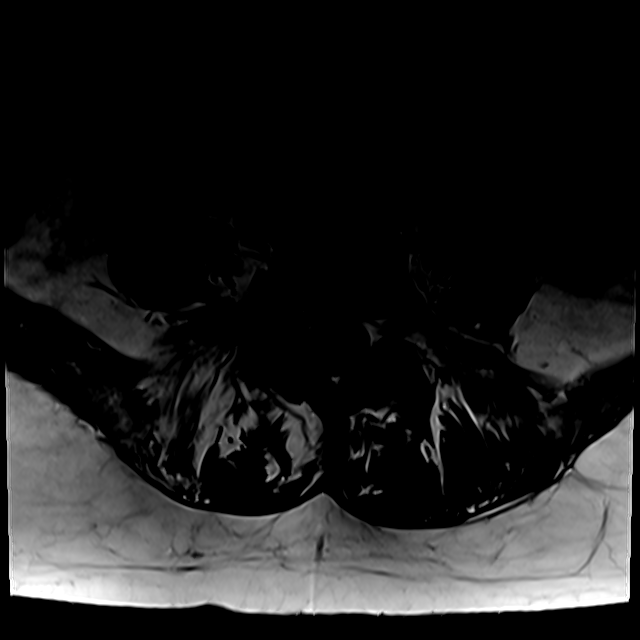
[im 19/37]
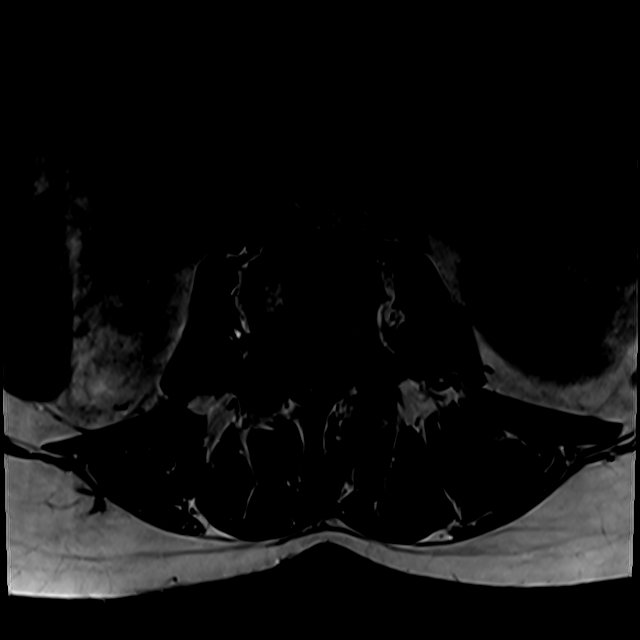
[im 31/37]
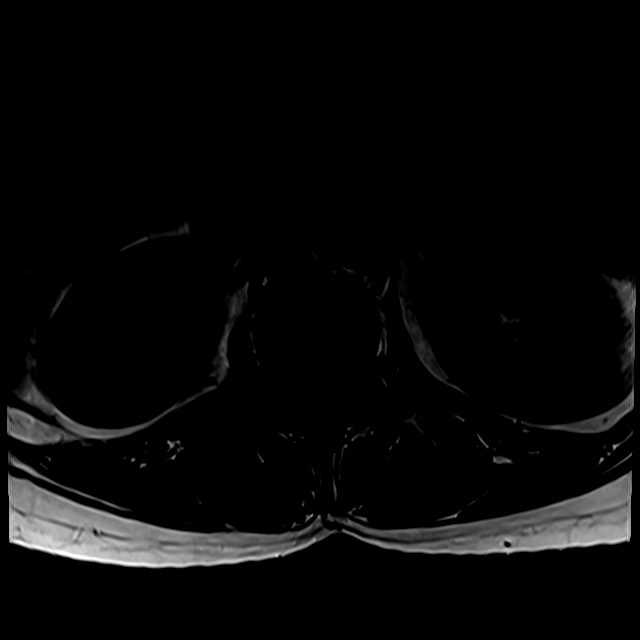

[Series 13: T2 · axial · 4.0mm · 0.28mm/px · z∈[-103,+72]mm · 6 of 37 slices shown (2 of 2)]
[im 1/37]
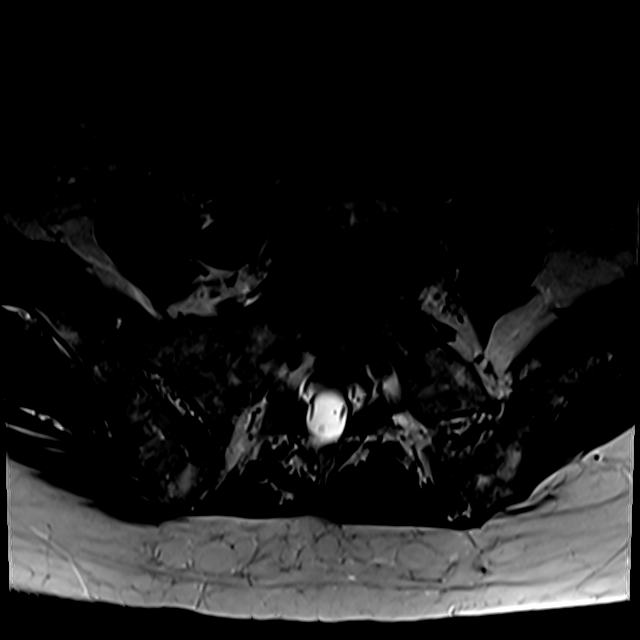
[im 6/37]
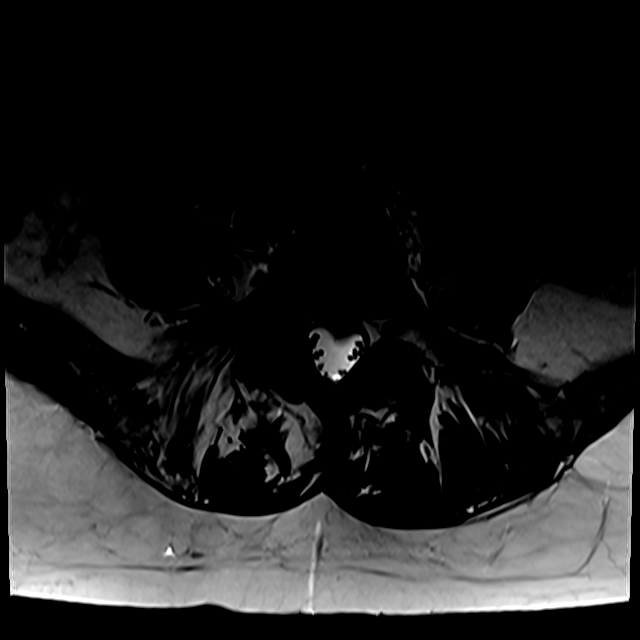
[im 11/37]
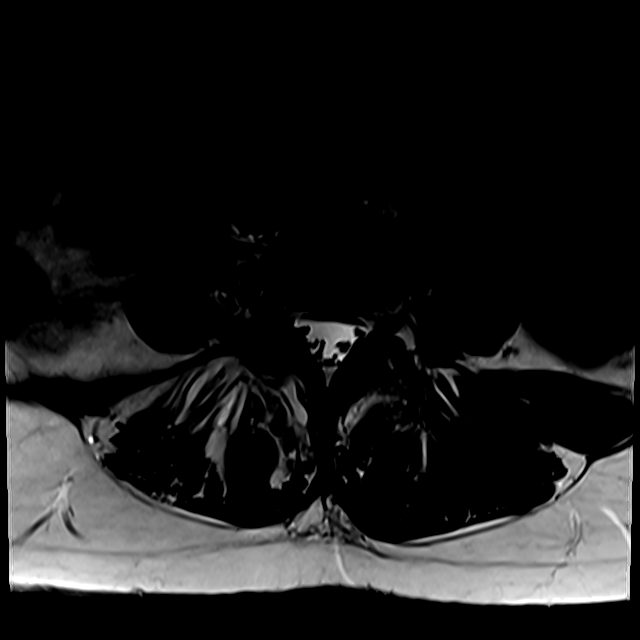
[im 16/37]
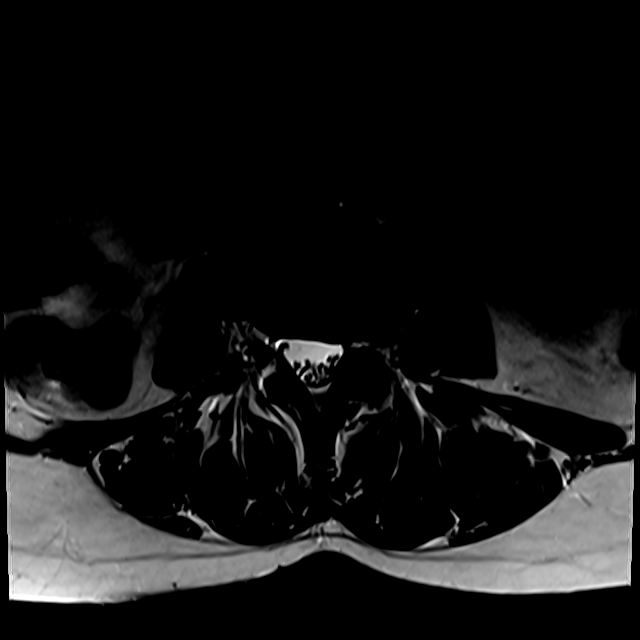
[im 19/37]
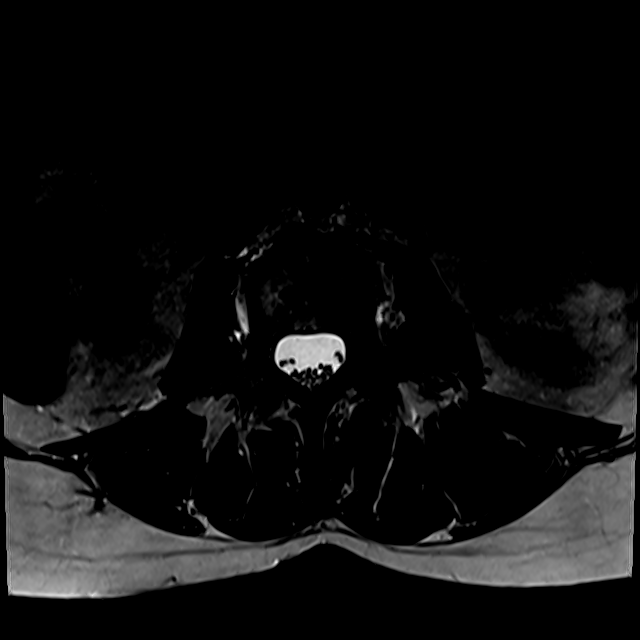
[im 31/37]
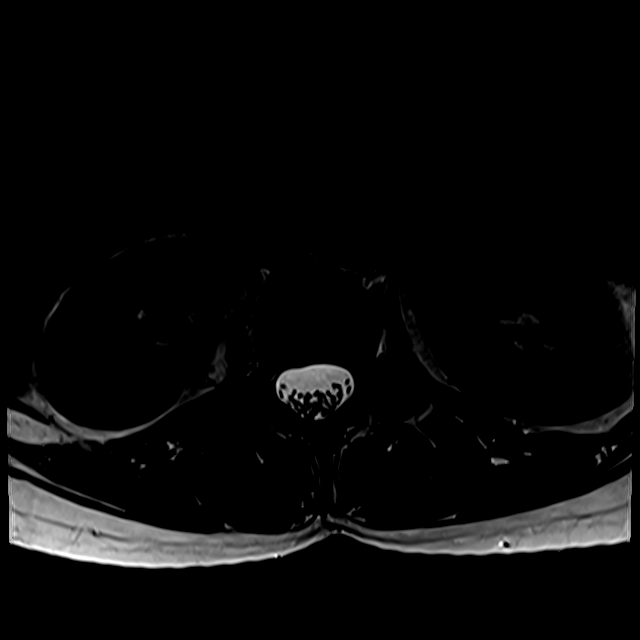

[18 of 48 positions shown; findings below may reference images not displayed]

FINDINGS: Segmentation:  Standard.

Alignment:  Physiologic.

Vertebrae: No fracture, evidence of discitis, or bone lesion. Mildly
heterogeneous bone marrow signal.

Conus medullaris and cauda equina: Conus extends to the L1 level.
Conus and cauda equina appear normal.

Paraspinal and other soft tissues: Negative

Disc levels:

T12-L1: Normal disc space and facet joints. There is no spinal canal
stenosis. No neural foraminal stenosis.

L1-L2: Normal disc space and facet joints. There is no spinal canal
stenosis. No neural foraminal stenosis.

L2-L3: Normal disc space and facet joints. There is no spinal canal
stenosis. No neural foraminal stenosis.

L3-L4: Left eccentric disc bulge. Left lateral recess narrowing
without central spinal canal stenosis. Mild left neural foraminal
stenosis.

L4-L5: Mild disc bulge and moderate facet hypertrophy. Right lateral
recess narrowing without central spinal canal stenosis. Moderate
right neural foraminal stenosis.

L5-S1: Small central protrusion of the disc. Mild facet hypertrophy.
There is no spinal canal stenosis. Mild right neural foraminal
stenosis.

Visualized sacrum: Normal.
IMPRESSION: 1. L4-L5 moderate right neural foraminal stenosis and narrowing of
the right lateral recess.
2. L3-L4 left lateral recess and left neural foraminal stenosis.
Correlate for left L3 or L4 radiculopathy.
3. L5-S1 mild right neural foraminal stenosis.

## 2021-12-12 ENCOUNTER — Other Ambulatory Visit: Payer: Self-pay | Admitting: Family Medicine

## 2021-12-12 DIAGNOSIS — Z1231 Encounter for screening mammogram for malignant neoplasm of breast: Secondary | ICD-10-CM

## 2021-12-27 ENCOUNTER — Ambulatory Visit
Admission: RE | Admit: 2021-12-27 | Discharge: 2021-12-27 | Disposition: A | Discharge status: Home / Self Care | Payer: BC Managed Care – PPO | Source: Ambulatory Visit | Attending: Family Medicine | Admitting: Family Medicine

## 2021-12-27 DIAGNOSIS — Z1231 Encounter for screening mammogram for malignant neoplasm of breast: Secondary | ICD-10-CM

## 2022-02-01 ENCOUNTER — Encounter: Payer: Self-pay | Admitting: Cardiology

## 2022-02-01 ENCOUNTER — Other Ambulatory Visit: Payer: Self-pay | Admitting: Cardiology

## 2022-02-01 ENCOUNTER — Other Ambulatory Visit: Payer: Self-pay

## 2022-02-01 ENCOUNTER — Ambulatory Visit: Payer: BC Managed Care – PPO | Admitting: Cardiology

## 2022-02-01 VITALS — BP 128/76 | HR 67 | Temp 98.2°F | Resp 17 | Ht 62.0 in | Wt 127.2 lb

## 2022-02-01 DIAGNOSIS — H3412 Central retinal artery occlusion, left eye: Secondary | ICD-10-CM | POA: Insufficient documentation

## 2022-02-01 DIAGNOSIS — Z794 Long term (current) use of insulin: Secondary | ICD-10-CM

## 2022-02-01 DIAGNOSIS — E7800 Pure hypercholesterolemia, unspecified: Secondary | ICD-10-CM

## 2022-02-01 DIAGNOSIS — G43109 Migraine with aura, not intractable, without status migrainosus: Secondary | ICD-10-CM

## 2022-02-01 MED ORDER — LOSARTAN POTASSIUM 25 MG PO TABS
25.0000 mg | ORAL_TABLET | Freq: Every evening | ORAL | 2 refills | Status: DC
Start: 2022-02-01 — End: 2022-02-19

## 2022-02-01 MED ORDER — EZETIMIBE 10 MG PO TABS: 10.0000 mg | ORAL_TABLET | Freq: Every day | ORAL | 2 refills | Status: DC

## 2022-02-01 MED ORDER — CLOPIDOGREL BISULFATE 75 MG PO TABS: 75.0000 mg | ORAL_TABLET | Freq: Every day | ORAL | 0 refills | Status: DC

## 2022-02-01 NOTE — Progress Notes (Signed)
Primary Physician/Referring:  [name], MD  Patient ID: Tammy Wu, female    DOB: 11/23/1961, 61 y.o.   MRN: [id number]  Chief Complaint  Patient presents with   New Patient (Initial Visit)    61 year   STROKE   HPI:    Tammy Wu  is a 61 y.o. Caucasian female patient with recent uncontrolled diabetes mellitus, with aura controlled on propranolol for tremors and also migraine along with topiramate, hyperlipidemia referred to me for cardiac risk stratification due to recent left CRAO.  Patient had COVID on 01/07/2022 and shortly thereafter she lost vision in her left eye and was diagnosed with ischemic optic neuropathy.  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  Past Medical History:  Diagnosis Date   Diabetes mellitus without complication ([place])    Thyroid disease    Past Surgical History:  Procedure Laterality Date   APPENDECTOMY     CARPAL TUNNEL RELEASE     uterine ablation     Family History  Problem Relation Age of Onset   Stroke Mother 64   Heart attack Father 60   Stroke Brother 44       MULTIPLE   Liver disease Brother    Heart disease Brother    Leukemia Brother    Liver disease Brother    Breast cancer Maternal Aunt 68    Social History   Tobacco Use   Smoking status: Never   Smokeless tobacco: Never  Substance Use Topics   Alcohol use: Yes    Comment: rarely   Marital Status: Married  ROS  Review of Systems  Eyes:  Positive for vision loss in left eye.  Cardiovascular:  Negative for chest pain, dyspnea on exertion and leg swelling.  Objective  Blood pressure 128/76, pulse 67, temperature 98.2 F (36.8 C), temperature source Temporal, resp. rate 17, height _0  (1.575 m), weight 127 lb 3.2 oz (57.7 kg), SpO2 96 %. Body mass index is 23.27 kg/m.  Vitals with BMI 02/01/2022 08/30/2020 04/26/2020  Height _1  - -  Weight 127 lbs 3 oz - -   BMI 20.94 - -  Systolic [phone]  Diastolic 76 87 83  Pulse 67 82 73    Physical Exam Neck:     Vascular: Carotid bruit (soft bilateral) present. No JVD.  Cardiovascular:     Rate and Rhythm: Normal rate and regular rhythm.     Pulses: Intact distal pulses.     Heart sounds: Normal heart sounds. No murmur heard.   No gallop.  Pulmonary:     Effort: Pulmonary effort is normal.     Breath sounds: Normal breath sounds.  Abdominal:     General: Bowel sounds are normal.     Palpations: Abdomen is soft.  Musculoskeletal:     Right lower leg: No edema.     Left lower leg: No edema.     Medications and allergies   Allergies  Allergen Reactions   Benadryl [Diphenhydramine] Anaphylaxis   Codeine Other (See Comments)    vomiting   Other Nausea And Vomiting   Ceclor [Cefaclor]    Metformin And Related Diarrhea    Has tried both IR and CR without improvement. Has tried both IR and CR without improvement.      Medication list after today's encounter    Current Outpatient Medications:  albuterol (PROVENTIL HFA;VENTOLIN HFA) 108 (90 Base) MCG/ACT inhaler, Inhale 1-2 puffs into the lungs every 6 (six) hours as needed for wheezing or shortness of breath., Disp: 1 Inhaler, Rfl: 3   aspirin 81 MG chewable tablet, Chew 81 mg by mouth daily., Disp: , Rfl:    ATORVASTATIN CALCIUM PO, Take 40 mg by mouth daily., Disp: , Rfl:    clopidogrel (PLAVIX) 75 MG tablet, Take 1 tablet (75 mg total) by mouth daily., Disp: 30 tablet, Rfl: 0   ezetimibe (ZETIA) 10 MG tablet, Take 1 tablet (10 mg total) by mouth daily., Disp: 30 tablet, Rfl: 2   fexofenadine (ALLEGRA) 60 MG tablet, Take 60 mg by mouth 2 (two) times daily., Disp: , Rfl:    Insulin Aspart, w/Niacinamide, (FIASP) 100 UNIT/ML SOLN, , Disp: , Rfl:    levothyroxine (SYNTHROID, LEVOTHROID) 112 MCG tablet, Take 112 mcg by mouth daily before breakfast., Disp: , Rfl:    losartan (COZAAR) 25 MG tablet, Take 1 tablet (25 mg total) by mouth  every evening., Disp: 30 tablet, Rfl: 2   Melatonin 1 MG CHEW, Chew 1 tablet by mouth at bedtime., Disp: , Rfl:    propranolol (INDERAL) 10 MG tablet, , Disp: , Rfl:    Topiramate ER (TROKENDI XR) 100 MG CP24, Take by mouth daily., Disp: , Rfl:    Ubrogepant 100 MG TABS, Take 1 tablet by mouth as needed., Disp: , Rfl:    zolmitriptan (ZOMIG) 5 MG tablet, Take by mouth., Disp: , Rfl:   Laboratory examination:   External labs:   Labs 06/11/2021:  Serum glucose 47 mg, BUN 17, creatinine 0.75, EGFR 91 mL, potassium 4.3.  Total cholesterol 183, triglycerides 115, HDL 65, LDL 99.  A1c 7.7%.  TSH normal at 2.730.  Radiology:     Cardiac Studies:   No results found for this or any previous visit from the past 1095 days.     No results found for this or any previous visit from the past 1095 days.   NA  EKG:     EKG 02/01/2022: Normal sinus rhythm at the rate of 65 bpm, incomplete right bundle branch block, normal EKG.  Assessment     ICD-10-CM   1. CRAO (central retinal artery occlusion), left  H34.12 EKG 12-Lead    PCV ECHOCARDIOGRAM COMPLETE    PCV CAROTID DUPLEX (BILATERAL)    losartan (COZAAR) 25 MG tablet    clopidogrel (PLAVIX) 75 MG tablet    2. Migraine with aura and without status migrainosus, not intractable  G43.109     3. Pure hypercholesterolemia  E78.00 ezetimibe (ZETIA) 10 MG tablet    4. Type 2 diabetes mellitus with hyperglycemia, with long-term current use of insulin (HCC)  E11.65 losartan (COZAAR) 25 MG tablet   Z79.4        Medications Discontinued During This Encounter  Medication Reason   celecoxib (CELEBREX) 100 MG capsule    diazepam (VALIUM) 2 MG tablet    gabapentin (NEURONTIN) 100 MG capsule    insulin degludec (TRESIBA) 100 UNIT/ML SOPN FlexTouch Pen    JUNEL 1.5/30 1.5-30 MG-MCG tablet    methocarbamol (ROBAXIN) 500 MG tablet    montelukast (SINGULAIR) 10 MG tablet    methylPREDNISolone acetate (DEPO-MEDROL) injection 80 mg     Meds  ordered this encounter  Medications   ezetimibe (ZETIA) 10 MG tablet    Sig: Take 1 tablet (10 mg total) by mouth daily.    Dispense:  30 tablet  Refill:  2   losartan (COZAAR) 25 MG tablet    Sig: Take 1 tablet (25 mg total) by mouth every evening.    Dispense:  30 tablet    Refill:  2   clopidogrel (PLAVIX) 75 MG tablet    Sig: Take 1 tablet (75 mg total) by mouth daily.    Dispense:  30 tablet    Refill:  0   Orders Placed This Encounter  Procedures   EKG 12-Lead   PCV ECHOCARDIOGRAM COMPLETE    Standing Status:   Future    Standing Expiration Date:   02/01/2023   Recommendations:   Tammy Wu is a 61 y.o.  Caucasian female patient with recent uncontrolled diabetes mellitus, with aura controlled on propranolol for tremors and also migraine along with topiramate, hyperlipidemia referred to me for cardiac risk stratification due to recent left CRAO.  Patient had COVID on 01/07/2022 and shortly thereafter she lost vision in her left eye and was diagnosed with ischemic optic neuropathy.  Patient's physical examination is unremarkable except for a very faint right carotid bruit.  Vascular examination is otherwise unremarkable, EKG is normal as well.  We will set her up for a carotid artery duplex along with an echocardiogram.  I have added Plavix 75 mg daily for 30 days, she has recently started aspirin 81 mg daily, continue the same indefinitely.  I doubt that this is related to atrial fibrillation.  With regard to hyperlipidemia, in view of central retinal artery occlusion, diabetes mellitus, will add Zetia 10 mg daily, goal LDL closer to 54.  In view of diabetes mellitus and vascular disease, I have added losartan 25 mg in the evening, she does not have hypertension, however for cardiovascular protection she can start with 12.5 mg in the evening and titrated up to 25 mg daily.  She is on a low-dose beta-blocker for both migraines and essential tremors, continue the same for now.   She may need cardiac ischemic work-up however would like to settle her down for now.  I will see her back in 6 weeks for follow-up.    Adrian Prows, MD, [facility] 02/01/2022, 10:14 AM Office: [phone]

## 2022-02-19 ENCOUNTER — Other Ambulatory Visit: Payer: Self-pay

## 2022-02-19 ENCOUNTER — Ambulatory Visit: Payer: BC Managed Care – PPO

## 2022-02-19 DIAGNOSIS — E1165 Type 2 diabetes mellitus with hyperglycemia: Secondary | ICD-10-CM

## 2022-02-19 DIAGNOSIS — H3412 Central retinal artery occlusion, left eye: Secondary | ICD-10-CM

## 2022-02-19 DIAGNOSIS — Z794 Long term (current) use of insulin: Secondary | ICD-10-CM

## 2022-02-19 MED ORDER — LOSARTAN POTASSIUM 25 MG PO TABS: 12.5000 mg | ORAL_TABLET | Freq: Every evening | ORAL | 2 refills | Status: DC

## 2022-02-25 ENCOUNTER — Telehealth: Payer: Self-pay

## 2022-02-25 ENCOUNTER — Encounter: Payer: Self-pay | Admitting: Cardiology

## 2022-02-25 NOTE — Telephone Encounter (Signed)
I will send her mychart message

## 2022-03-14 ENCOUNTER — Ambulatory Visit: Payer: BC Managed Care – PPO | Admitting: Cardiology

## 2022-03-14 ENCOUNTER — Encounter: Payer: Self-pay | Admitting: Cardiology

## 2022-03-14 ENCOUNTER — Other Ambulatory Visit: Payer: Self-pay

## 2022-03-14 VITALS — BP 109/73 | HR 68 | Temp 97.6°F | Resp 16 | Ht 62.0 in | Wt 125.6 lb

## 2022-03-14 DIAGNOSIS — E7800 Pure hypercholesterolemia, unspecified: Secondary | ICD-10-CM

## 2022-03-14 DIAGNOSIS — Z794 Long term (current) use of insulin: Secondary | ICD-10-CM

## 2022-03-14 DIAGNOSIS — H3412 Central retinal artery occlusion, left eye: Secondary | ICD-10-CM

## 2022-03-14 MED ORDER — LOSARTAN POTASSIUM 25 MG PO TABS: 12.5000 mg | ORAL_TABLET | Freq: Every evening | ORAL | 3 refills | Status: DC

## 2022-03-14 MED ORDER — EZETIMIBE 10 MG PO TABS: 10.0000 mg | ORAL_TABLET | Freq: Every day | ORAL | 3 refills | Status: DC

## 2022-03-14 NOTE — Progress Notes (Deleted)
? ?Primary Physician/Referring:  Tammy Noon, MD ? ?Patient ID: Tammy Wu, female    DOB: 04-11-1961, 61 y.o.   MRN: [id number] ? ?No chief complaint on file. ? ?HPI:   ? ?Tammy Wu  is a 61 y.o. Caucasian female patient with recent uncontrolled diabetes mellitus, with aura controlled on propranolol for tremors and also migraine along with topiramate, hyperlipidemia referred to me for cardiac risk stratification due to recent left CRAO.  Patient had COVID on 01/07/2022 and shortly thereafter she lost vision in her left eye and was diagnosed with ischemic optic neuropathy. ? 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 ? ?Past Medical History:  ?Diagnosis Date  ? Diabetes mellitus without complication ([place])   ? Thyroid disease   ? ?Past Surgical History:  ?Procedure Laterality Date  ? APPENDECTOMY    ? CARPAL TUNNEL RELEASE    ? uterine ablation    ? ?Family History  ?Problem Relation Age of Onset  ? Stroke Mother 36  ? Heart attack Father 23  ? Stroke Brother 28  ?     MULTIPLE  ? Liver disease Brother   ? Heart disease Brother   ? Leukemia Brother   ? Liver disease Brother   ? Breast cancer Maternal Aunt 1  ?  ?Social History  ? ?Tobacco Use  ? Smoking status: Never  ? Smokeless tobacco: Never  ?Substance Use Topics  ? Alcohol use: Yes  ?  Comment: rarely  ? ?Marital Status: Married  ?ROS  ?Review of Systems  ?Eyes:  Positive for vision loss in left eye.  ?Cardiovascular:  Negative for chest pain, dyspnea on exertion and leg swelling.  ?Objective  ?There were no vitals taken for this visit. There is no height or weight on file to calculate BMI.  ? ?  02/01/2022  ?  9:18 AM 08/30/2020  ?  2:59 PM 04/26/2020  ? 12:56 PM  ?Vitals with BMI  ?Height '5\' 2"'     ?Weight 127 lbs 3 oz    ?BMI 23.26    ?Systolic 132 440 102  ?Diastolic 76 87 83  ?Pulse 67 82 73  ?  ?Physical Exam ?Neck:  ?   Vascular: Carotid bruit (soft  bilateral) present. No JVD.  ?Cardiovascular:  ?   Rate and Rhythm: Normal rate and regular rhythm.  ?   Pulses: Intact distal pulses.  ?   Heart sounds: Normal heart sounds. No murmur heard. ?  No gallop.  ?Pulmonary:  ?   Effort: Pulmonary effort is normal.  ?   Breath sounds: Normal breath sounds.  ?Abdominal:  ?   General: Bowel sounds are normal.  ?   Palpations: Abdomen is soft.  ?Musculoskeletal:  ?   Right lower leg: No edema.  ?   Left lower leg: No edema.  ?  ? ?Medications and allergies  ? ?Allergies  ?Allergen Reactions  ? Benadryl [Diphenhydramine] Anaphylaxis  ? Codeine Other (See Comments)  ?  vomiting  ? Other Nausea And Vomiting  ? Ceclor [Cefaclor]   ? Metformin And Related Diarrhea  ?  Has tried both IR and CR without improvement. ?Has tried both IR and CR without improvement. ?  ?  ? ?Medication list after today's encounter  ? ? ?Current Outpatient Medications:  ?  albuterol (PROVENTIL HFA;VENTOLIN HFA) 108 (90 Base) MCG/ACT inhaler, Inhale 1-2 puffs into the  lungs every 6 (six) hours as needed for wheezing or shortness of breath., Disp: 1 Inhaler, Rfl: 3 ?  aspirin 81 MG chewable tablet, Chew 81 mg by mouth daily., Disp: , Rfl:  ?  ATORVASTATIN CALCIUM PO, Take 40 mg by mouth daily., Disp: , Rfl:  ?  clopidogrel (PLAVIX) 75 MG tablet, TAKE 1 TABLET(75 MG) BY MOUTH DAILY, Disp: 90 tablet, Rfl: 3 ?  ezetimibe (ZETIA) 10 MG tablet, Take 1 tablet (10 mg total) by mouth daily., Disp: 30 tablet, Rfl: 2 ?  fexofenadine (ALLEGRA) 60 MG tablet, Take 60 mg by mouth 2 (two) times daily., Disp: , Rfl:  ?  Insulin Aspart, w/Niacinamide, (FIASP) 100 UNIT/ML SOLN, , Disp: , Rfl:  ?  levothyroxine (SYNTHROID, LEVOTHROID) 112 MCG tablet, Take 112 mcg by mouth daily before breakfast., Disp: , Rfl:  ?  losartan (COZAAR) 25 MG tablet, Take 0.5 tablets (12.5 mg total) by mouth every evening., Disp: 15 tablet, Rfl: 2 ?  Melatonin 1 MG CHEW, Chew 1 tablet by mouth at bedtime., Disp: , Rfl:  ?  propranolol (INDERAL) 10  MG tablet, , Disp: , Rfl:  ?  Topiramate ER (TROKENDI XR) 100 MG CP24, Take by mouth daily., Disp: , Rfl:  ?  Ubrogepant 100 MG TABS, Take 1 tablet by mouth as needed., Disp: , Rfl:  ?  zolmitriptan (ZOMIG) 5 MG tablet, Take by mouth., Disp: , Rfl:  ? ?Laboratory examination:  ? ?External labs:  ? ?Labs 06/11/2021: ? ?Serum glucose 47 mg, BUN 17, creatinine 0.75, EGFR 91 mL, potassium 4.3. ? ?Total cholesterol 183, triglycerides 115, HDL 65, LDL 99. ? ?A1c 7.7%.  TSH normal at 2.730. ? ?Radiology:  ? ? ? ?Cardiac Studies:  ? ?Carotid artery duplex 02/19/2022: ?Duplex suggests stenosis in the right internal carotid artery (1-15%). There is moderate diffuse homogeneous plaque throughout the right common carotid artery. ?Duplex suggests stenosis in the left internal carotid artery (1-15%). There is diffuse homogeneous plaque throughout the left common carotid artery and moderate heterogeneous plaque in the left carotid bulb.  ?This could be the source of cerebral emboli. ?Antegrade right vertebral artery flow. Antegrade left vertebral artery flow. ? ?PCV ECHOCARDIOGRAM COMPLETE 02/19/2022  ?Normal LV systolic function with visual EF 60-65%. Left ventricle cavity is normal in size. Normal left ventricular wall thickness. Normal global wall motion. Normal diastolic filling pattern, normal LAP. ?Trace tricuspid regurgitation. ?Based on color doppler no intracardiac shunt was seen. ?No prior study for comparison. ?   ? ?EKG:  ? ?  EKG 02/01/2022: Normal sinus rhythm at the rate of 65 bpm, incomplete right bundle branch block, normal EKG. ? ?Assessment  ? ?  ICD-10-CM   ?1. CRAO (central retinal artery occlusion), left  H34.12   ?  ?2. Migraine with aura and without status migrainosus, not intractable  G43.109   ?  ?3. Pure hypercholesterolemia  E78.00   ?  ?  ? ?There are no discontinued medications. ?  ?No orders of the defined types were placed in this encounter. ? ?No orders of the defined types were placed in this  encounter. ? ?Recommendations:  ? ?Tammy Wu is a 61 y.o.  Caucasian female patient with recent uncontrolled diabetes mellitus, with aura controlled on propranolol for tremors and also migraine along with topiramate, hyperlipidemia referred to me for cardiac risk stratification due to recent left CRAO.  Patient had COVID on 01/07/2022 and shortly thereafter she lost vision in her left eye and was diagnosed with ischemic  optic neuropathy. ? ?Patient's physical examination is unremarkable except for a very faint right carotid bruit.  Vascular examination is otherwise unremarkable, EKG is normal as well. ? ?We will set her up for a carotid artery duplex along with an echocardiogram.  I have added Plavix 75 mg daily for 30 days, she has recently started aspirin 81 mg daily, continue the same indefinitely.  I doubt that this is related to atrial fibrillation. ? ?With regard to hyperlipidemia, in view of central retinal artery occlusion, diabetes mellitus, will add Zetia 10 mg daily, goal LDL closer to 54. ? ?In view of diabetes mellitus and vascular disease, I have added losartan 25 mg in the evening, she does not have hypertension, however for cardiovascular protection she can start with 12.5 mg in the evening and titrated up to 25 mg daily.  She is on a low-dose beta-blocker for both migraines and essential tremors, continue the same for now.  She may need cardiac ischemic work-up however would like to settle her down for now.  I will see her back in 6 weeks for follow-up. ? ? ? ?Adrian Prows, MD, [facility] ?03/14/2022, 9:21 AM ?Office: [phone] ?

## 2022-03-14 NOTE — Progress Notes (Signed)
? ?Primary Physician/Referring:  [name], MD ? ?Patient ID: Tammy Wu, female    DOB: 03-15-1961, 61 y.o.   MRN: [id number] ? ?Chief Complaint  ?Patient presents with  ? Left rentinal artery occlusion  ? Hyperlipidemia  ? ?HPI:   ? ?Tammy Wu  is a 61 y.o. Caucasian female patient with recent uncontrolled diabetes mellitus, with aura controlled on propranolol for tremors and also migraine along with topiramate, hyperlipidemia referred to me for cardiac risk stratification due to recent left CRAO.  Patient had COVID on 01/07/2022 and shortly thereafter she lost vision in her left eye and was diagnosed with ischemic optic neuropathy. ? ?Patient has nearly lost complete vision in left eye, she denies any chest pain or shortness of breath, she is a Engineer, mining at [employer].  She now presents for a 6-week office visit, underwent echocardiogram and also carotid artery duplex.  I had started her on losartan for diabetes and vascular disease, Plavix in view of recent CVA, Zetia to improve her lipid status.  She is tolerating all the medications but was able to tolerate only 12.5 mg of losartan.  No other specific symptoms. ? ?Past Medical History:  ?Diagnosis Date  ? Diabetes mellitus without complication ([place])   ? Thyroid disease   ? ?Past Surgical History:  ?Procedure Laterality Date  ? APPENDECTOMY    ? CARPAL TUNNEL RELEASE    ? uterine ablation    ? ?Family History  ?Problem Relation Age of Onset  ? Stroke Mother 72  ? Heart attack Father 53  ? Stroke Brother 6  ?     MULTIPLE  ? Liver disease Brother   ? Heart disease Brother   ? Leukemia Brother   ? Liver disease Brother   ? Breast cancer Maternal Aunt 65  ?  ?Social History  ? ?Tobacco Use  ? Smoking status: Never  ? Smokeless tobacco: Never  ?Substance Use Topics  ? Alcohol use: Yes  ?  Comment: rarely  ? ?Marital Status: Married  ?ROS  ?Review of Systems  ?Eyes:  Positive for vision loss in left eye.  ?Cardiovascular:  Negative for chest pain,  dyspnea on exertion and leg swelling.  ?Objective  ?Blood pressure 109/73, pulse 68, temperature 97.6 ?F (36.4 ?C), temperature source Temporal, resp. rate 16, height '5\' 2"'  (1.575 m), weight 125 lb 9.6 oz (57 kg), SpO2 96 %. Body mass index is 22.97 kg/m?.  ? ?  03/14/2022  ? 10:39 AM 02/01/2022  ?  9:18 AM 08/30/2020  ?  2:59 PM  ?Vitals with BMI  ?Height '5\' 2"'  '5\' 2"'    ?Weight 125 lbs 10 oz 127 lbs 3 oz   ?BMI 22.97 23.26   ?Systolic 237 628 315  ?Diastolic 73 76 87  ?Pulse 68 67 82  ?  ?Physical Exam ?Neck:  ?   Vascular: Carotid bruit (soft bilateral) present. No JVD.  ?Cardiovascular:  ?   Rate and Rhythm: Normal rate and regular rhythm.  ?   Pulses: Intact distal pulses.  ?   Heart sounds: Normal heart sounds. No murmur heard. ?  No gallop.  ?Pulmonary:  ?   Effort: Pulmonary effort is normal.  ?   Breath sounds: Normal breath sounds.  ?Abdominal:  ?   General: Bowel sounds are normal.  ?   Palpations: Abdomen is soft.  ?Musculoskeletal:  ?   Right lower leg: No edema.  ?   Left lower leg: No edema.  ?  ? ?  Medications and allergies  ? ?Allergies  ?Allergen Reactions  ? Benadryl [Diphenhydramine] Anaphylaxis  ? Codeine Other (See Comments)  ?  vomiting  ? Other Nausea And Vomiting  ? Ceclor [Cefaclor]   ? Metformin And Related Diarrhea  ?  Has tried both IR and CR without improvement. ?Has tried both IR and CR without improvement. ?  ?  ? ?Medication list after today's encounter  ? ? ?Current Outpatient Medications:  ?  albuterol (PROVENTIL HFA;VENTOLIN HFA) 108 (90 Base) MCG/ACT inhaler, Inhale 1-2 puffs into the lungs every 6 (six) hours as needed for wheezing or shortness of breath., Disp: 1 Inhaler, Rfl: 3 ?  ATORVASTATIN CALCIUM PO, Take 40 mg by mouth daily., Disp: , Rfl:  ?  clopidogrel (PLAVIX) 75 MG tablet, TAKE 1 TABLET(75 MG) BY MOUTH DAILY, Disp: 90 tablet, Rfl: 3 ?  fexofenadine (ALLEGRA) 60 MG tablet, Take 60 mg by mouth 2 (two) times daily., Disp: , Rfl:  ?  Insulin Aspart, w/Niacinamide, (FIASP) 100  UNIT/ML SOLN, , Disp: , Rfl:  ?  levothyroxine (SYNTHROID, LEVOTHROID) 112 MCG tablet, Take 112 mcg by mouth daily before breakfast., Disp: , Rfl:  ?  Melatonin 1 MG CHEW, Chew 1 tablet by mouth at bedtime., Disp: , Rfl:  ?  propranolol (INDERAL) 10 MG tablet, , Disp: , Rfl:  ?  Topiramate ER (TROKENDI XR) 100 MG CP24, Take by mouth daily., Disp: , Rfl:  ?  Ubrogepant 100 MG TABS, Take 1 tablet by mouth as needed. UBRELVY, Disp: , Rfl:  ?  ezetimibe (ZETIA) 10 MG tablet, Take 1 tablet (10 mg total) by mouth daily., Disp: 100 tablet, Rfl: 3 ?  losartan (COZAAR) 25 MG tablet, Take 0.5 tablets (12.5 mg total) by mouth every evening., Disp: 60 tablet, Rfl: 3 ?  zolmitriptan (ZOMIG) 5 MG tablet, Take by mouth., Disp: , Rfl:  ? ?Laboratory examination:  ? ?No results found for: CHOL, HDL, LDLCALC, LDLDIRECT, TRIG, CHOLHDL   ?External labs:  ? ?Labs 06/11/2021: ? ?Serum glucose 47 mg, BUN 17, creatinine 0.75, EGFR 91 mL, potassium 4.3. ? ?Total cholesterol 183, triglycerides 115, HDL 65, LDL 99. ? ?A1c 7.7%.  TSH normal at 2.730. ? ?Radiology:  ? ? ? ?Cardiac Studies:  ? ?Carotid artery duplex 02/19/2022: ?Duplex suggests stenosis in the right internal carotid artery (1-15%). There is moderate diffuse homogeneous plaque throughout the right common carotid artery. ?Duplex suggests stenosis in the left internal carotid artery (1-15%). There is diffuse homogeneous plaque throughout the left common carotid artery and moderate heterogeneous plaque in the left carotid bulb.  ?This could be the source of cerebral emboli. ?Antegrade right vertebral artery flow. Antegrade left vertebral artery flow. ? ?PCV ECHOCARDIOGRAM COMPLETE 02/19/2022  ?Normal LV systolic function with visual EF 60-65%. Left ventricle cavity is normal in size. Normal left ventricular wall thickness. Normal global wall motion. Normal diastolic filling pattern, normal LAP. ?Trace tricuspid regurgitation. ?Based on color doppler no intracardiac shunt was  seen. ?No prior study for comparison. ?   ?EKG:  ? ?  EKG 02/01/2022: Normal sinus rhythm at the rate of 65 bpm, incomplete right bundle branch block, normal EKG. ? ?Assessment  ? ?  ICD-10-CM   ?1. CRAO (central retinal artery occlusion), left  H34.12 losartan (COZAAR) 25 MG tablet  ?  ?2. Pure hypercholesterolemia  E78.00 Lipid Panel With LDL/HDL Ratio  ?  ezetimibe (ZETIA) 10 MG tablet  ?  ?3. Type 2 diabetes mellitus with hyperglycemia, with long-term current use of  insulin (HCC)  E11.65 losartan (COZAAR) 25 MG tablet  ? Z79.4   ?  ?  ? ?Medications Discontinued During This Encounter  ?Medication Reason  ? aspirin 81 MG chewable tablet Discontinued by provider  ? ezetimibe (ZETIA) 10 MG tablet Reorder  ? losartan (COZAAR) 25 MG tablet Reorder  ?  ?Meds ordered this encounter  ?Medications  ? losartan (COZAAR) 25 MG tablet  ?  Sig: Take 0.5 tablets (12.5 mg total) by mouth every evening.  ?  Dispense:  60 tablet  ?  Refill:  3  ? ezetimibe (ZETIA) 10 MG tablet  ?  Sig: Take 1 tablet (10 mg total) by mouth daily.  ?  Dispense:  100 tablet  ?  Refill:  3  ? ?Orders Placed This Encounter  ?Procedures  ? Lipid Panel With LDL/HDL Ratio  ? ?Recommendations:  ? ?CYANNE DELMAR is a 61 y.o.  Caucasian female patient with recent uncontrolled diabetes mellitus, with aura controlled on propranolol for tremors and also migraine along with topiramate, hyperlipidemia referred to me for cardiac risk stratification due to recent left CRAO.  Patient had COVID on 01/07/2022 and shortly thereafter she lost vision in her left eye and was diagnosed with ischemic optic neuropathy. ? ?Patient was seen by me 6 weeks ago and now presents for follow-up, I started her on losartan 25 mg daily in view of diabetes mellitus and vascular disease and also added Zetia to bring her LDL closer to 55.  She is tolerating the medication of about as blood pressure is soft she is only taking 12.5 mg of losartan. ? ?I reviewed the results of the carotid  artery duplex, she has significant amount of plaque burden which is probably the etiology for her central retinal artery occlusion left eye. ? ?She is tolerating Plavix, as she has a 90-day supply, advised her

## 2022-03-15 LAB — LIPID PANEL WITH LDL/HDL RATIO
Cholesterol, Total: 151 mg/dL (ref 100–199)
HDL: 69 mg/dL (ref >39)
LDL Chol Calc (NIH): 66 mg/dL (ref 0–99)
LDL/HDL Ratio: 1 ratio (ref 0.0–3.2)
Triglycerides: 82 mg/dL (ref 0–149)
VLDL Cholesterol Cal: 16 mg/dL (ref 5–40)

## 2022-09-16 ENCOUNTER — Encounter: Payer: Self-pay | Admitting: Cardiology

## 2022-09-16 ENCOUNTER — Ambulatory Visit: Payer: BC Managed Care – PPO | Admitting: Cardiology

## 2022-09-16 VITALS — BP 117/76 | HR 61 | Temp 97.8°F | Ht 62.0 in | Wt 124.8 lb

## 2022-09-16 DIAGNOSIS — H3412 Central retinal artery occlusion, left eye: Secondary | ICD-10-CM

## 2022-09-16 DIAGNOSIS — E1165 Type 2 diabetes mellitus with hyperglycemia: Secondary | ICD-10-CM

## 2022-09-16 DIAGNOSIS — E7800 Pure hypercholesterolemia, unspecified: Secondary | ICD-10-CM

## 2022-09-16 MED ORDER — LOSARTAN POTASSIUM 25 MG PO TABS: 12.5000 mg | ORAL_TABLET | Freq: Every evening | ORAL | 3 refills | Status: AC

## 2022-09-16 MED ORDER — EZETIMIBE 10 MG PO TABS: 10.0000 mg | ORAL_TABLET | Freq: Every day | ORAL | 3 refills | Status: AC

## 2022-09-16 NOTE — Progress Notes (Signed)
Primary Physician/Referring:  [name], MD  Patient ID: Tammy Wu, female    DOB: [birth year], 61 y.o.   MRN: [id number]  Chief Complaint  Patient presents with   CRAO   Hyperlipidemia   Follow-up    6 months   HPI:    Tammy Wu  is a 61 y.o. Caucasian female patient with recent uncontrolled diabetes mellitus, with aura controlled on propranolol for tremors and also migraine along with topiramate, hyperlipidemia referred to me for cardiac risk stratification due to recent left CRAO.  Patient had COVID on 01/07/2022 and shortly thereafter she lost vision in her left eye and was diagnosed with ischemic optic neuropathy.  Presently doing well, she underwent bilateral cataract extraction, her left eye vision is also improving. She is tolerating all the medications but was able to tolerate only 12.5 mg of losartan.  No other specific symptoms.  Past Medical History:  Diagnosis Date   Diabetes mellitus without complication ([place])    Hyperlipidemia    Thyroid disease    Past Surgical History:  Procedure Laterality Date   APPENDECTOMY     CARPAL TUNNEL RELEASE     uterine ablation     Family History  Problem Relation Age of Onset   Stroke Mother 43   Heart attack Father 33   Stroke Brother 62       MULTIPLE   Liver disease Brother    Heart disease Brother    Leukemia Brother    Liver disease Brother    Breast cancer Maternal Aunt 68    Social History   Tobacco Use   Smoking status: Never   Smokeless tobacco: Never  Substance Use Topics   Alcohol use: Yes    Comment: rarely   Marital Status: Married  ROS  Review of Systems  Eyes:  Positive for vision loss in left eye.  Cardiovascular:  Negative for chest pain, dyspnea on exertion and leg swelling.   Objective  Blood pressure 117/76, pulse 61, temperature 97.8 F (36.6 C), temperature source Temporal, height '5\' 2"'  (1.575 m), weight 124 lb 12.8 oz (56.6 kg), SpO2 (!) 16 %. Body mass index is 22.83 kg/m.      09/16/2022   10:07 AM 03/14/2022   10:39 AM 02/01/2022    9:18 AM  Vitals with BMI  Height '5\' 2"'  '5\' 2"'  '5\' 2"'   Weight 124 lbs 13 oz 125 lbs 10 oz 127 lbs 3 oz  BMI 22.82 22.48 25.00  Systolic 370 488 891  Diastolic 76 73 76  Pulse 61 68 67    Physical Exam Neck:     Vascular: Carotid bruit (soft bilateral) present. No JVD.  Cardiovascular:     Rate and Rhythm: Normal rate and regular rhythm.     Pulses: Intact distal pulses.     Heart sounds: Normal heart sounds. No murmur heard.    No gallop.  Pulmonary:     Effort: Pulmonary effort is normal.     Breath sounds: Normal breath sounds.  Abdominal:     General: Bowel sounds are normal.     Palpations: Abdomen is soft.  Musculoskeletal:     Right lower leg: No edema.     Left lower leg: No edema.      Medications and allergies   Allergies  Allergen Reactions   Benadryl [Diphenhydramine] Anaphylaxis   Codeine Other (See Comments)    vomiting   Other Nausea And Vomiting   Ceclor [Cefaclor]  Metformin And Related Diarrhea    Has tried both IR and CR without improvement. Has tried both IR and CR without improvement.     Medication list after today's encounter    Current Outpatient Medications:    albuterol (PROVENTIL HFA;VENTOLIN HFA) 108 (90 Base) MCG/ACT inhaler, Inhale 1-2 puffs into the lungs every 6 (six) hours as needed for wheezing or shortness of breath., Disp: 1 Inhaler, Rfl: 3   ASPIRIN 81 PO, Take 1 tablet by mouth daily., Disp: , Rfl:    atorvastatin (LIPITOR) 40 MG tablet, Take 40 mg by mouth daily., Disp: , Rfl:    fexofenadine (ALLEGRA) 60 MG tablet, Take 60 mg by mouth 2 (two) times daily., Disp: , Rfl:    Insulin Aspart, w/Niacinamide, (FIASP) 100 UNIT/ML SOLN, , Disp: , Rfl:    levothyroxine (SYNTHROID, LEVOTHROID) 112 MCG tablet, Take 112 mcg by mouth daily before breakfast., Disp: , Rfl:    Melatonin 1 MG CHEW, Chew 1 tablet by mouth at bedtime., Disp: , Rfl:    propranolol (INDERAL) 10 MG  tablet, , Disp: , Rfl:    Topiramate ER (TROKENDI XR) 100 MG CP24, Take by mouth daily., Disp: , Rfl:    Ubrogepant 100 MG TABS, Take 1 tablet by mouth as needed. UBRELVY, Disp: , Rfl:    ezetimibe (ZETIA) 10 MG tablet, Take 1 tablet (10 mg total) by mouth daily., Disp: 90 tablet, Rfl: 3   losartan (COZAAR) 25 MG tablet, Take 0.5 tablets (12.5 mg total) by mouth every evening., Disp: 90 tablet, Rfl: 3   zolmitriptan (ZOMIG) 5 MG tablet, Take by mouth., Disp: , Rfl:   Laboratory examination:   Lab Results  Component Value Date   CHOL 151 03/14/2022   HDL 69 03/14/2022   LDLCALC 66 03/14/2022   TRIG 82 03/14/2022     External labs:   Labs 05/01/2022:  Total cholesterol 145, triglycerides 96, HDL 68, LDL 59.  A1c 7.4%.  Labs 06/11/2021:  Serum glucose 47 mg, BUN 17, creatinine 0.75, EGFR 91 mL, potassium 4.3.  Total cholesterol 183, triglycerides 115, HDL 65, LDL 99.  A1c 7.7%.  TSH normal at 2.730.  Radiology:    Cardiac Studies:   Carotid artery duplex March 15, 2022: Duplex suggests stenosis in the right internal carotid artery (1-15%). There is moderate diffuse homogeneous plaque throughout the right common carotid artery. Duplex suggests stenosis in the left internal carotid artery (1-15%). There is diffuse homogeneous plaque throughout the left common carotid artery and moderate heterogeneous plaque in the left carotid bulb.  This could be the source of cerebral emboli. Antegrade right vertebral artery flow. Antegrade left vertebral artery flow.  PCV ECHOCARDIOGRAM COMPLETE 15-Mar-2022  Normal LV systolic function with visual EF 60-65%. Left ventricle cavity is normal in size. Normal left ventricular wall thickness. Normal global wall motion. Normal diastolic filling pattern, normal LAP. Trace tricuspid regurgitation. Based on color doppler no intracardiac shunt was seen. No prior study for comparison.    EKG:     EKG 09/16/2022: Normal sinus rhythm at rate of 61 bpm,  left atrial enlargement, otherwise normal EKG. no significant change from 02/01/2022.   Assessment     ICD-10-CM   1. CRAO (central retinal artery occlusion), left  H34.12 EKG 12-Lead    PCV CARDIAC STRESS TEST    losartan (COZAAR) 25 MG tablet    2. Pure hypercholesterolemia  E78.00 PCV CARDIAC STRESS TEST    ezetimibe (ZETIA) 10 MG tablet    3. Type 2 diabetes  mellitus with hyperglycemia, with long-term current use of insulin (HCC)  E11.65 losartan (COZAAR) 25 MG tablet   Z79.4        Medications Discontinued During This Encounter  Medication Reason   ATORVASTATIN CALCIUM PO    clopidogrel (PLAVIX) 75 MG tablet Completed Course   losartan (COZAAR) 25 MG tablet Reorder   ezetimibe (ZETIA) 10 MG tablet Reorder    Meds ordered this encounter  Medications   ezetimibe (ZETIA) 10 MG tablet    Sig: Take 1 tablet (10 mg total) by mouth daily.    Dispense:  90 tablet    Refill:  3   losartan (COZAAR) 25 MG tablet    Sig: Take 0.5 tablets (12.5 mg total) by mouth every evening.    Dispense:  90 tablet    Refill:  3   Orders Placed This Encounter  Procedures   PCV CARDIAC STRESS TEST    Standing Status:   Future    Standing Expiration Date:   11/16/2022   EKG 12-Lead   Recommendations:   EILIANA DRONE is a 61 y.o.  Caucasian female patient with recent uncontrolled diabetes mellitus, with aura controlled on propranolol for tremors and also migraine along with topiramate, hyperlipidemia referred to me for cardiac risk stratification due to recent left CRAO.  Patient had COVID on 01/07/2022 and shortly thereafter she lost vision in her left eye and was diagnosed with ischemic optic neuropathy.  Presently doing well, she underwent bilateral cataract extraction, her left eye vision is also improving.  Her LDL reviewed, is at goal now with closer to 55.  She is tolerating Zetia without any side effects.  She is also tolerating low-dose of losartan in view of being diabetic and also mild  vascular disease.  I will continue the same.  Reviewed her external labs.  With regard to diabetes mellitus, vascular disease, as her A1c is still elevated, could consider addition of Farxiga 10 mg daily.  To further cardiac risk stratify her, although asymptomatic, and scheduled for a routine treadmill exercise stress test. I will personally perform the test and if I find abnormalities,  will perform further evaluation. Otherwise unless new on ongoing symptoms(patient advised to contact us), preventive  therapy is recommended. I will then see the patient on a PRN basis.  Advised her to discontinue clopidogrel and she will continue aspirin indefinitely.     Adrian Prows, MD, [facility] 09/16/2022, 10:55 AM Office: [phone]       CC: Jenel Lucks (Endocrine)

## 2022-10-18 ENCOUNTER — Ambulatory Visit: Payer: BC Managed Care – PPO

## 2022-10-18 DIAGNOSIS — E7800 Pure hypercholesterolemia, unspecified: Secondary | ICD-10-CM

## 2022-10-18 DIAGNOSIS — H3412 Central retinal artery occlusion, left eye: Secondary | ICD-10-CM

## 2022-10-31 NOTE — Progress Notes (Signed)
Exercise treadmill stress test 10/18/2022: Functional status: Poor.  Chest pain: None.  Reason for stopping exercise: Maximal exercise capacity.  Hypertensive response to exercise: None. Heart rate response to exercise: Accelerated Exercise time 4 minutes 05 seconds on Bruce protocol, achieved 5.93 METS, 88% of age-predicted maximum heart rate. Stress ECG equivocal for ischemia - upsloping ST depressions noted during stress resolved by 4 minutes into recovery. Clinical correlation required. No prior studies available for comparison.  Personally reviewed, upsloping ST depression back to baseline at < 1 min into recovery. Negative for ischemia

## 2022-11-13 ENCOUNTER — Other Ambulatory Visit: Payer: Self-pay

## 2022-11-13 DIAGNOSIS — Z1231 Encounter for screening mammogram for malignant neoplasm of breast: Secondary | ICD-10-CM

## 2023-01-14 ENCOUNTER — Ambulatory Visit
Admission: RE | Admit: 2023-01-14 | Discharge: 2023-01-14 | Disposition: A | Discharge status: Home / Self Care | Payer: BC Managed Care – PPO | Source: Ambulatory Visit | Attending: Family Medicine | Admitting: Family Medicine

## 2023-01-14 DIAGNOSIS — Z1231 Encounter for screening mammogram for malignant neoplasm of breast: Secondary | ICD-10-CM

## 2023-02-20 ENCOUNTER — Encounter: Payer: Self-pay | Admitting: Radiology

## 2023-08-14 ENCOUNTER — Encounter: Payer: Self-pay | Admitting: Cardiology

## 2023-08-27 ENCOUNTER — Encounter: Payer: Self-pay | Admitting: Podiatry

## 2023-08-27 ENCOUNTER — Ambulatory Visit: Payer: BC Managed Care – PPO | Admitting: Podiatry

## 2023-08-27 VITALS — BP 136/71

## 2023-08-27 DIAGNOSIS — B351 Tinea unguium: Secondary | ICD-10-CM

## 2023-08-27 DIAGNOSIS — M79674 Pain in right toe(s): Secondary | ICD-10-CM

## 2023-08-27 DIAGNOSIS — M79675 Pain in left toe(s): Secondary | ICD-10-CM

## 2023-08-27 DIAGNOSIS — E119 Type 2 diabetes mellitus without complications: Secondary | ICD-10-CM | POA: Diagnosis not present

## 2023-08-27 NOTE — Progress Notes (Signed)
  Subjective:  Patient ID: Tammy Wu, female    DOB: 1961-09-25,   MRN: [id number]  Chief Complaint  Patient presents with   Diabetes    [facility]    62 y.o. female presents for concern of thickened elongated and painful nails that are difficult to trim. Requesting to have them trimmed today. Relates burning and tingling in their feet. Patient is diabetic and last A1c was 7.3 I 2023 but relates her A1c has been in the 8s recently.   PCP:  Eartha Inch, MD    . Denies any other pedal complaints. Denies n/v/f/c.   Past Medical History:  Diagnosis Date   Diabetes mellitus without complication (HCC)    Hyperlipidemia    Thyroid disease     Objective:  Physical Exam: Vascular: DP/PT pulses 2/4 bilateral. CFT <3 seconds. Absent hair growth on digits. Edema noted to bilateral lower extremities. Xerosis noted bilaterally.  Skin. No lacerations or abrasions bilateral feet. Nails 1-5 bilateral  are thickened discolored and elongated with subungual debris. Incurvation of medial border or right great toe. No erythema edema or purulence noted.  Musculoskeletal: MMT 5/5 bilateral lower extremities in DF, PF, Inversion and Eversion. Deceased ROM in DF of ankle joint.  Neurological: Sensation intact to light touch. Protective sensation diminished bilateral.     Assessment:   1. Diabetes mellitus without complication (HCC)   2. Pain due to onychomycosis of toenails of both feet      Plan:  Patient was evaluated and treated and all questions answered. -Discussed and educated patient on diabetic foot care, especially with  regards to the vascular, neurological and musculoskeletal systems.  -Stressed the importance of good glycemic control and the detriment of not  controlling glucose levels in relation to the foot. -Discussed supportive shoes at all times and checking feet regularly.  -Mechanically debrided all nails 1-5 bilateral using sterile nail nipper and filed with dremel without  incident  -Answered all patient questions -Patient to return  in 3 months for at risk foot care -Discussed potential for ingrown nail procedures in the future but would want A1c under 7.5.  -Patient advised to call the office if any problems or questions arise in the meantime.   Louann Sjogren, DPM

## 2023-09-25 ENCOUNTER — Other Ambulatory Visit: Payer: Self-pay | Admitting: Cardiology

## 2023-09-25 DIAGNOSIS — H3412 Central retinal artery occlusion, left eye: Secondary | ICD-10-CM

## 2023-09-25 DIAGNOSIS — E1165 Type 2 diabetes mellitus with hyperglycemia: Secondary | ICD-10-CM

## 2023-11-26 ENCOUNTER — Ambulatory Visit (INDEPENDENT_AMBULATORY_CARE_PROVIDER_SITE_OTHER): Payer: BC Managed Care – PPO | Admitting: Podiatry

## 2023-11-26 DIAGNOSIS — M79675 Pain in left toe(s): Secondary | ICD-10-CM

## 2023-11-26 DIAGNOSIS — M79674 Pain in right toe(s): Secondary | ICD-10-CM

## 2023-11-26 DIAGNOSIS — B351 Tinea unguium: Secondary | ICD-10-CM | POA: Diagnosis not present

## 2023-11-26 DIAGNOSIS — E119 Type 2 diabetes mellitus without complications: Secondary | ICD-10-CM

## 2023-11-26 NOTE — Progress Notes (Signed)
  Subjective:  Patient ID: Tammy Wu, female    DOB: 01-Aug-1961,   MRN: [id number]  No chief complaint on file.   62 y.o. female presents for concern of thickened elongated and painful nails that are difficult to trim. Requesting to have them trimmed today. Relates burning and tingling in their feet. Patient is diabetic and last A1c was 7.3 I 2023 but relates her A1c has been in the 8s recently.   PCP:  Eartha Inch, MD    . Denies any other pedal complaints. Denies n/v/f/c.   Past Medical History:  Diagnosis Date   Diabetes mellitus without complication (HCC)    Hyperlipidemia    Thyroid disease     Objective:  Physical Exam: Vascular: DP/PT pulses 2/4 bilateral. CFT <3 seconds. Absent hair growth on digits. Edema noted to bilateral lower extremities. Xerosis noted bilaterally.  Skin. No lacerations or abrasions bilateral feet. Nails 1-5 bilateral  are thickened discolored and elongated with subungual debris. Incurvation of medial border or right great toe. No erythema edema or purulence noted.  Musculoskeletal: MMT 5/5 bilateral lower extremities in DF, PF, Inversion and Eversion. Deceased ROM in DF of ankle joint.  Neurological: Sensation intact to light touch. Protective sensation diminished bilateral.     Assessment:   1. Pain due to onychomycosis of toenails of both feet   2. Diabetes mellitus without complication (HCC)      Plan:  Patient was evaluated and treated and all questions answered. -Discussed and educated patient on diabetic foot care, especially with  regards to the vascular, neurological and musculoskeletal systems.  -Stressed the importance of good glycemic control and the detriment of not  controlling glucose levels in relation to the foot. -Discussed supportive shoes at all times and checking feet regularly.  -Mechanically debrided all nails 1-5 bilateral using sterile nail nipper and filed with dremel without incident  -Answered all patient  questions -Patient to return  in 3 months for at risk foot care -Discussed potential for ingrown nail procedures in the future but would want A1c under 7.5.  -Patient advised to call the office if any problems or questions arise in the meantime.   Louann Sjogren, DPM

## 2023-12-08 LAB — COLOGUARD: COLOGUARD: NEGATIVE

## 2023-12-08 LAB — EXTERNAL GENERIC LAB PROCEDURE: COLOGUARD: NEGATIVE

## 2024-01-05 ENCOUNTER — Encounter: Payer: Self-pay | Admitting: Physician Assistant

## 2024-01-05 DIAGNOSIS — Z1231 Encounter for screening mammogram for malignant neoplasm of breast: Secondary | ICD-10-CM

## 2024-01-06 ENCOUNTER — Other Ambulatory Visit: Payer: Self-pay | Admitting: Adult Health Nurse Practitioner

## 2024-01-06 DIAGNOSIS — N644 Mastodynia: Secondary | ICD-10-CM

## 2024-01-06 DIAGNOSIS — N6310 Unspecified lump in the right breast, unspecified quadrant: Secondary | ICD-10-CM

## 2024-01-08 ENCOUNTER — Ambulatory Visit
Admission: RE | Admit: 2024-01-08 | Discharge: 2024-01-08 | Disposition: A | Discharge status: Home / Self Care | Payer: 59 | Source: Ambulatory Visit | Attending: Adult Health Nurse Practitioner | Admitting: Adult Health Nurse Practitioner

## 2024-01-08 ENCOUNTER — Other Ambulatory Visit: Payer: Self-pay | Admitting: Adult Health Nurse Practitioner

## 2024-01-08 DIAGNOSIS — N6310 Unspecified lump in the right breast, unspecified quadrant: Secondary | ICD-10-CM

## 2024-01-08 DIAGNOSIS — N644 Mastodynia: Secondary | ICD-10-CM

## 2024-01-22 ENCOUNTER — Other Ambulatory Visit: Payer: Self-pay

## 2024-02-24 ENCOUNTER — Ambulatory Visit: Payer: BC Managed Care – PPO | Admitting: Podiatry

## 2024-03-23 ENCOUNTER — Ambulatory Visit: Payer: BC Managed Care – PPO | Admitting: Podiatry

## 2024-04-05 ENCOUNTER — Ambulatory Visit: Admitting: Podiatry

## 2024-04-05 DIAGNOSIS — E119 Type 2 diabetes mellitus without complications: Secondary | ICD-10-CM | POA: Diagnosis not present

## 2024-04-05 DIAGNOSIS — B351 Tinea unguium: Secondary | ICD-10-CM

## 2024-04-05 DIAGNOSIS — M79675 Pain in left toe(s): Secondary | ICD-10-CM | POA: Diagnosis not present

## 2024-04-05 DIAGNOSIS — M79674 Pain in right toe(s): Secondary | ICD-10-CM | POA: Diagnosis not present

## 2024-04-05 NOTE — Progress Notes (Signed)
  Subjective:  Patient ID: Tammy Wu, female    DOB: 01-Aug-1961,   MRN: [id number]  No chief complaint on file.   63 y.o. female presents for concern of thickened elongated and painful nails that are difficult to trim. Requesting to have them trimmed today. Relates burning and tingling in their feet. Patient is diabetic and last A1c was 7.3 I 2023 but relates her A1c has been in the 8s recently.   PCP:  Eartha Inch, MD    . Denies any other pedal complaints. Denies n/v/f/c.   Past Medical History:  Diagnosis Date   Diabetes mellitus without complication (HCC)    Hyperlipidemia    Thyroid disease     Objective:  Physical Exam: Vascular: DP/PT pulses 2/4 bilateral. CFT <3 seconds. Absent hair growth on digits. Edema noted to bilateral lower extremities. Xerosis noted bilaterally.  Skin. No lacerations or abrasions bilateral feet. Nails 1-5 bilateral  are thickened discolored and elongated with subungual debris. Incurvation of medial border or right great toe. No erythema edema or purulence noted.  Musculoskeletal: MMT 5/5 bilateral lower extremities in DF, PF, Inversion and Eversion. Deceased ROM in DF of ankle joint.  Neurological: Sensation intact to light touch. Protective sensation diminished bilateral.     Assessment:   1. Pain due to onychomycosis of toenails of both feet   2. Diabetes mellitus without complication (HCC)      Plan:  Patient was evaluated and treated and all questions answered. -Discussed and educated patient on diabetic foot care, especially with  regards to the vascular, neurological and musculoskeletal systems.  -Stressed the importance of good glycemic control and the detriment of not  controlling glucose levels in relation to the foot. -Discussed supportive shoes at all times and checking feet regularly.  -Mechanically debrided all nails 1-5 bilateral using sterile nail nipper and filed with dremel without incident  -Answered all patient  questions -Patient to return  in 3 months for at risk foot care -Discussed potential for ingrown nail procedures in the future but would want A1c under 7.5.  -Patient advised to call the office if any problems or questions arise in the meantime.   Louann Sjogren, DPM

## 2024-07-12 ENCOUNTER — Ambulatory Visit: Admitting: Podiatry

## 2024-09-06 ENCOUNTER — Other Ambulatory Visit: Payer: Self-pay

## 2024-09-06 DIAGNOSIS — M79671 Pain in right foot: Secondary | ICD-10-CM

## 2024-10-06 ENCOUNTER — Ambulatory Visit (HOSPITAL_COMMUNITY)
Admission: RE | Admit: 2024-10-06 | Discharge: 2024-10-06 | Disposition: A | Discharge status: Home / Self Care | Source: Ambulatory Visit | Attending: Vascular Surgery | Admitting: Vascular Surgery

## 2024-10-06 ENCOUNTER — Encounter: Payer: Self-pay | Admitting: Vascular Surgery

## 2024-10-06 ENCOUNTER — Ambulatory Visit: Admitting: Vascular Surgery

## 2024-10-06 VITALS — BP 133/72 | HR 70 | Temp 97.8°F | Ht 62.0 in | Wt 136.0 lb

## 2024-10-06 DIAGNOSIS — M79671 Pain in right foot: Secondary | ICD-10-CM | POA: Diagnosis present

## 2024-10-06 DIAGNOSIS — M79672 Pain in left foot: Secondary | ICD-10-CM

## 2024-10-06 LAB — VAS US ABI WITH/WO TBI
Left ABI: 1.27
Right ABI: 1.17

## 2024-10-06 NOTE — Progress Notes (Addendum)
 Patient ID: Tammy Wu, female   DOB: 1961/01/06, 63 y.o.   MRN: [id number]  Reason for Consult: New Patient (Initial Visit)   Referred by Kit Rush, MD  Subjective:     HPI:  Tammy Wu is a 63 y.o. female with history of type 1 diabetes.  She has a long history of right foot pain has been followed by both podiatry and now orthopedics.  She does not have any tissue loss or ulceration.  She denies frank claudication.  States that the pain is constant and moderate to severe in nature.  She describes it as a stabbing pain occasionally throbbing.  She does have peripheral neuropathy.  Past Medical History:  Diagnosis Date   Diabetes mellitus without complication (HCC)    Hyperlipidemia    Thyroid disease    Family History  Problem Relation Age of Onset   Stroke Mother 37   Heart attack Father 76   Stroke Brother 5       MULTIPLE   Liver disease Brother    Heart disease Brother    Leukemia Brother    Liver disease Brother    Breast cancer Maternal Aunt 47   Past Surgical History:  Procedure Laterality Date   APPENDECTOMY     CARPAL TUNNEL RELEASE     uterine ablation      Short Social History:  Social History   Tobacco Use   Smoking status: Never   Smokeless tobacco: Never  Substance Use Topics   Alcohol use: Yes    Comment: rarely    Allergies  Allergen Reactions   Benadryl [Diphenhydramine] Anaphylaxis   Codeine Other (See Comments)    vomiting   Diphenhydramine Hcl Hives   Other Nausea And Vomiting   Propoxyphene Nausea And Vomiting and Nausea Only   Ceclor [Cefaclor]    Metformin And Related Diarrhea    Has tried both IR and CR without improvement. Has tried both IR and CR without improvement.     Current Outpatient Medications  Medication Sig Dispense Refill   albuterol  (PROVENTIL  HFA;VENTOLIN  HFA) 108 (90 Base) MCG/ACT inhaler Inhale 1-2 puffs into the lungs every 6 (six) hours as needed for wheezing or shortness of breath. 1 Inhaler 3    ASPIRIN 81 PO Take 1 tablet by mouth daily.     atorvastatin (LIPITOR) 40 MG tablet Take 40 mg by mouth daily.     ezetimibe  (ZETIA ) 10 MG tablet Take 1 tablet (10 mg total) by mouth daily. 90 tablet 3   fexofenadine (ALLEGRA) 60 MG tablet Take 60 mg by mouth 2 (two) times daily.     Insulin Aspart, w/Niacinamide, (FIASP) 100 UNIT/ML SOLN      levothyroxine (SYNTHROID, LEVOTHROID) 112 MCG tablet Take 112 mcg by mouth daily before breakfast.     losartan  (COZAAR ) 25 MG tablet Take 0.5 tablets (12.5 mg total) by mouth every evening. 90 tablet 3   Melatonin 1 MG CHEW Chew 1 tablet by mouth at bedtime.     propranolol (INDERAL) 10 MG tablet      Topiramate ER (TROKENDI XR) 100 MG CP24 Take by mouth daily.     Ubrogepant 100 MG TABS Take 1 tablet by mouth as needed. UBRELVY     No current facility-administered medications for this visit.    Review of Systems  Constitutional:  Constitutional negative. HENT: HENT negative.  Eyes: Positive for loss of vision.   Respiratory: Respiratory negative.  Cardiovascular: Cardiovascular negative.  GI: Gastrointestinal negative.  Musculoskeletal:       Right foot pain Skin: Skin negative.  Neurological: Neurological negative. Hematologic: Hematologic/lymphatic negative.  Psychiatric: Psychiatric negative.        Objective:  Objective   Vitals:   10/06/24 1422  BP: 133/72  Pulse: 70  Temp: 97.8 F (36.6 C)  SpO2: 95%  Weight: 136 lb (61.7 kg)  Height: 5' 2 (1.575 m)   Body mass index is 24.87 kg/m.  Physical Exam HENT:     Head: Normocephalic.     Nose: Nose normal.     Mouth/Throat:     Mouth: Mucous membranes are moist.  Cardiovascular:     Rate and Rhythm: Normal rate.     Pulses:          Dorsalis pedis pulses are 2+ on the right side and 2+ on the left side.       Posterior tibial pulses are 2+ on the right side and 2+ on the left side.  Pulmonary:     Effort: Pulmonary effort is normal.  Abdominal:     General: Abdomen  is flat.  Musculoskeletal:        General: Normal range of motion.     Cervical back: Normal range of motion.     Right lower leg: No edema.     Left lower leg: No edema.  Skin:    General: Skin is warm and dry.     Capillary Refill: Capillary refill takes less than 2 seconds.  Neurological:     General: No focal deficit present.     Mental Status: She is alert.  Psychiatric:        Mood and Affect: Mood normal.     Data: ABI Findings:  +---------+------------------+-----+---------+--------+  Right   Rt Pressure (mmHg)IndexWaveform Comment   +---------+------------------+-----+---------+--------+  Brachial 146                                       +---------+------------------+-----+---------+--------+  PTA     171               1.17 triphasic          +---------+------------------+-----+---------+--------+  DP      163               1.12 triphasic          +---------+------------------+-----+---------+--------+  Great Toe181               1.24 Normal             +---------+------------------+-----+---------+--------+   +---------+------------------+-----+---------+-------+  Left    Lt Pressure (mmHg)IndexWaveform Comment  +---------+------------------+-----+---------+-------+  Brachial 143                                      +---------+------------------+-----+---------+-------+  PTA     185               1.27 triphasic         +---------+------------------+-----+---------+-------+  DP      163               1.12 triphasic         +---------+------------------+-----+---------+-------+  Great Toe163               1.12 Normal            +---------+------------------+-----+---------+-------+   +-------+-----------+-----------+------------+------------+  ABI/TBIToday's ABIToday's TBIPrevious ABIPrevious TBI  +-------+-----------+-----------+------------+------------+  Right 1.17       1.24                                  +-------+-----------+-----------+------------+------------+  Left  1.27       1.12                                 +-------+-----------+-----------+------------+------------+           Summary:  Right: Resting right ankle-brachial index is within normal range. The  right toe-brachial index is normal.    Left: Resting left ankle-brachial index is within normal range. The left  toe-brachial index is normal.         Assessment/Plan:     63 year old female with right lower extremity pain with preserved ABIs and palpable pulses.  Pain does not appear to have a vascular component.  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  As such I will have her follow-up in 1 year with repeat carotid duplex for further evaluation.  If no concern at that time she can see us  on an as-needed basis.     Penne Lonni [place] MD [facility]

## 2024-12-20 ENCOUNTER — Other Ambulatory Visit: Payer: Self-pay | Admitting: Physician Assistant

## 2024-12-20 DIAGNOSIS — Z1231 Encounter for screening mammogram for malignant neoplasm of breast: Secondary | ICD-10-CM

## 2025-01-17 ENCOUNTER — Ambulatory Visit

## 2025-01-25 ENCOUNTER — Ambulatory Visit
Admission: RE | Admit: 2025-01-25 | Discharge: 2025-01-25 | Disposition: A | Source: Ambulatory Visit | Attending: Physician Assistant

## 2025-01-25 DIAGNOSIS — Z1231 Encounter for screening mammogram for malignant neoplasm of breast: Secondary | ICD-10-CM

## 2025-01-28 ENCOUNTER — Encounter: Payer: Self-pay | Admitting: Podiatry

## 2025-01-28 ENCOUNTER — Ambulatory Visit: Admitting: Podiatry

## 2025-01-28 DIAGNOSIS — G5761 Lesion of plantar nerve, right lower limb: Secondary | ICD-10-CM

## 2025-01-28 DIAGNOSIS — B351 Tinea unguium: Secondary | ICD-10-CM

## 2025-01-28 DIAGNOSIS — E119 Type 2 diabetes mellitus without complications: Secondary | ICD-10-CM

## 2025-01-28 NOTE — Progress Notes (Signed)
"  °  Subjective:  Patient ID: Tammy Wu, female    DOB: December 22, 1961,   MRN: [id number]  Chief Complaint  Patient presents with   Diabetes    I have a place on the bottom of my left foot that is very sore.  It feels like a knot.  My toenails need to be cut. Saw Dr. Beryl - 12/07/2024; A1c - 6.9    64 y.o. female presents for diabetic foot check. Also concern of thickened elongated and painful nails that are difficult to trim. Requesting to have them trimmed today. Relates burning and tingling in their feet. Patient is diabetic and last A1c was 6.9.  She does relate she has been dealing with right foot pain for a while that worsened over the holidays. She states she has seen her PCP and orthopedics and they check vascular and no cause for foot pain identified. Relates tingling and stabbing pain over the top of her right foot. She denies any current treatments. She has tried anti-inflammatories in the past without relief.   PCP:  Sophronia Ozell BROCKS, MD    . Denies any other pedal complaints. Denies n/v/f/c.   Past Medical History:  Diagnosis Date   Diabetes mellitus without complication (HCC)    Hyperlipidemia    Thyroid disease     Objective:  Physical Exam: Vascular: DP/PT pulses 2/4 bilateral. CFT <3 seconds. Absent hair growth on digits. Edema noted to bilateral lower extremities. Xerosis noted bilaterally.  Skin. No lacerations or abrasions bilateral feet. Nails 1-5 bilateral  are thickened discolored and elongated with subungual debris.Hyperkeratotic cored lesion noted to plantar left distal heel.  Musculoskeletal: MMT 5/5 bilateral lower extremities in DF, PF, Inversion and Eversion. Deceased ROM in DF of ankle joint.  Tender to second interspace on the right foot with pain with metatarsal squeeze and positive Mulder's click noted. Neurological: Sensation intact to light touch. Protective sensation diminished bilateral.     Assessment:   1. Encounter for diabetic foot exam (HCC)    2. Pain due to onychomycosis of toenails of both feet   3. Diabetes mellitus without complication (HCC)       Plan:  Patient was evaluated and treated and all questions answered. -Discussed and educated patient on diabetic foot care, especially with  regards to the vascular, neurological and musculoskeletal systems.  -Stressed the importance of good glycemic control and the detriment of not  controlling glucose levels in relation to the foot. -Discussed supportive shoes at all times and checking feet regularly.  -Mechanically debrided all nails 1-5 bilateral using sterile nail nipper and filed with dremel without incident  -Answered all patient questions Discussed neuroma and treatment options with patient.  -Review of previous notes from primary care provider orthopedics and vascular surgeon. Injection offered today. Patient deferred Discussed padding and offloading today.  Continue anti-inflammatories as needed. Discussed if pain does not improve may consider injection or MRI for further surgical planning.  Patient to return in 6 weeks or sooner if concerns arise.       Asberry Failing, DPM    "

## 2025-03-16 ENCOUNTER — Ambulatory Visit: Admitting: Podiatry
